# Patient Record
Sex: Male | Born: 1935 | Race: Black or African American | Hispanic: No | Marital: Married | Smoking: Former smoker
Health system: Southern US, Community
[De-identification: ages and names within clinical notes are randomized; demographics above are authoritative.]

## PROBLEM LIST (undated history)

## (undated) DIAGNOSIS — E538 Deficiency of other specified B group vitamins: Secondary | ICD-10-CM

## (undated) DIAGNOSIS — Z8673 Personal history of transient ischemic attack (TIA), and cerebral infarction without residual deficits: Secondary | ICD-10-CM

## (undated) DIAGNOSIS — H3530 Unspecified macular degeneration: Secondary | ICD-10-CM

## (undated) DIAGNOSIS — N183 Chronic kidney disease, stage 3 unspecified: Secondary | ICD-10-CM

## (undated) DIAGNOSIS — C61 Malignant neoplasm of prostate: Secondary | ICD-10-CM

## (undated) DIAGNOSIS — N200 Calculus of kidney: Secondary | ICD-10-CM

## (undated) DIAGNOSIS — G3184 Mild cognitive impairment, so stated: Secondary | ICD-10-CM

## (undated) DIAGNOSIS — H409 Unspecified glaucoma: Secondary | ICD-10-CM

## (undated) HISTORY — DX: Unspecified macular degeneration: H35.30

## (undated) HISTORY — DX: Deficiency of other specified B group vitamins: E53.8

## (undated) HISTORY — DX: Chronic kidney disease, stage 3 unspecified: N18.30

## (undated) HISTORY — DX: Malignant neoplasm of prostate: C61

## (undated) HISTORY — DX: Calculus of kidney: N20.0

## (undated) HISTORY — DX: Personal history of transient ischemic attack (TIA), and cerebral infarction without residual deficits: Z86.73

## (undated) HISTORY — DX: Mild cognitive impairment of uncertain or unknown etiology: G31.84

## (undated) HISTORY — DX: Chronic kidney disease, stage 3 (moderate): N18.3

## (undated) HISTORY — DX: Unspecified glaucoma: H40.9

---

## 1993-09-15 DIAGNOSIS — C61 Malignant neoplasm of prostate: Secondary | ICD-10-CM

## 1993-09-15 HISTORY — DX: Malignant neoplasm of prostate: C61

## 1993-09-15 HISTORY — PX: PROSTATE SURGERY: SHX751

## 2015-10-16 LAB — BASIC METABOLIC PANEL
BUN: 17 mg/dL (ref 4–21)
Creatinine: 1.6 mg/dL — AB (ref 0.6–1.3)
GLUCOSE: 78 mg/dL
Potassium: 4.3 mmol/L (ref 3.4–5.3)
SODIUM: 140 mmol/L (ref 137–147)

## 2015-10-16 LAB — HEPATIC FUNCTION PANEL
ALT: 6 U/L — AB (ref 10–40)
AST: 13 U/L — AB (ref 14–40)
Alkaline Phosphatase: 80 U/L (ref 25–125)
BILIRUBIN, TOTAL: 0.3 mg/dL

## 2015-10-16 LAB — CBC AND DIFFERENTIAL
HEMATOCRIT: 36 % — AB (ref 41–53)
HEMOGLOBIN: 11.2 g/dL — AB (ref 13.5–17.5)
PLATELETS: 187 10*3/uL (ref 150–399)
WBC: 4.5 10*3/mL

## 2016-02-22 ENCOUNTER — Telehealth: Payer: Self-pay | Admitting: *Deleted

## 2016-02-22 NOTE — Telephone Encounter (Signed)
Unable to reach patient at time of pre-visit call. Appt confirmed w/ pt's son who stated that pt "doesn't speak." Unable to complete pre-visit info w/ pt's son as there is no DPR on file.

## 2016-02-25 ENCOUNTER — Encounter: Payer: Self-pay | Admitting: Family Medicine

## 2016-02-25 ENCOUNTER — Ambulatory Visit (INDEPENDENT_AMBULATORY_CARE_PROVIDER_SITE_OTHER): Payer: Medicare Other | Admitting: Family Medicine

## 2016-02-25 VITALS — BP 109/70 | HR 64 | Temp 98.3°F | Ht 66.0 in | Wt 108.0 lb

## 2016-02-25 DIAGNOSIS — E538 Deficiency of other specified B group vitamins: Secondary | ICD-10-CM | POA: Diagnosis not present

## 2016-02-25 DIAGNOSIS — D508 Other iron deficiency anemias: Secondary | ICD-10-CM | POA: Diagnosis not present

## 2016-02-25 DIAGNOSIS — L309 Dermatitis, unspecified: Secondary | ICD-10-CM | POA: Diagnosis not present

## 2016-02-25 LAB — CBC WITH DIFFERENTIAL/PLATELET
BASOS PCT: 0.6 % (ref 0.0–3.0)
Basophils Absolute: 0 10*3/uL (ref 0.0–0.1)
EOS PCT: 3.7 % (ref 0.0–5.0)
Eosinophils Absolute: 0.2 10*3/uL (ref 0.0–0.7)
HCT: 33.9 % — ABNORMAL LOW (ref 39.0–52.0)
Hemoglobin: 10.8 g/dL — ABNORMAL LOW (ref 13.0–17.0)
LYMPHS ABS: 1.4 10*3/uL (ref 0.7–4.0)
Lymphocytes Relative: 32.1 % (ref 12.0–46.0)
MCHC: 31.9 g/dL (ref 30.0–36.0)
MCV: 87.6 fl (ref 78.0–100.0)
MONOS PCT: 8.1 % (ref 3.0–12.0)
Monocytes Absolute: 0.3 10*3/uL (ref 0.1–1.0)
NEUTROS PCT: 55.5 % (ref 43.0–77.0)
Neutro Abs: 2.3 10*3/uL (ref 1.4–7.7)
Platelets: 202 10*3/uL (ref 150.0–400.0)
RBC: 3.87 Mil/uL — AB (ref 4.22–5.81)
RDW: 14.9 % (ref 11.5–15.5)
WBC: 4.2 10*3/uL (ref 4.0–10.5)

## 2016-02-25 LAB — FERRITIN: FERRITIN: 311.6 ng/mL (ref 22.0–322.0)

## 2016-02-25 LAB — VITAMIN B12: Vitamin B-12: 463 pg/mL (ref 211–911)

## 2016-02-25 MED ORDER — TRIAMCINOLONE ACETONIDE 0.1 % EX CREA: 1.0000 "application " | TOPICAL_CREAM | Freq: Two times a day (BID) | CUTANEOUS | Status: DC

## 2016-02-25 NOTE — Progress Notes (Signed)
[place] at [facility] [address], [place], [place] [address] [phone]  Date:  02/25/2016   Name:  Jonathan Lawson   DOB:  [birth year]   MRN:  [id number]  PCP:  Lamar Blinks, MD    Chief Complaint: Establish Care   History of Present Illness:  Jonathan Lawson is a 82 y.o. very pleasant male patient who presents with the following:  Here today to establish care- he has moved to this area to be closer to his son.    He has generally been in good health over the years, and just used B12 shots and po iron He has been getting B12 shots for a couple of years.   He last had a b12shot last month.  They wonder if they might be able to use an oral supplement for convenience   He has had surgery for prostate cancer He and his wife of 64 years live with their son.  He worked in Engineer, maintenance, and also worked as a Theme park manager. They had 9 children, 1 passed away about 8 years ago.   They have noticed a hyperpigmntation in the flexor surfaces of both ankles for a couple of weeks.  It is somewhat itchy There are no active problems to display for this patient.   No past medical history on file.  No past surgical history on file.  Social History  Substance Use Topics  . Smoking status: Not on file  . Smokeless tobacco: Not on file  . Alcohol Use: Not on file    No family history on file.  No Known Allergies  Medication list has been reviewed and updated.  No current outpatient prescriptions on file prior to visit.   No current facility-administered medications on file prior to visit.    Review of Systems:  As per HPI- otherwise negative.   Physical Examination: Filed Vitals:   02/25/16 1341  BP: 109/70  Pulse: 64  Temp: 98.3 F (36.8 C)    Ideal Body Weight:    GEN: WDWN, NAD, Non-toxic, A & O x 3, slim build, does not say much but defers to his family. Looks well HEENT: Atraumatic, Normocephalic. Neck supple.  No masses, No LAD. Ears and Nose: No external deformity. CV: RRR, No M/G/R. No JVD. No thrill. No extra heart sounds. PULM: CTA B, no wheezes, crackles, rhonchi. No retractions. No resp. distress. No accessory muscle use. ABD: S, NT, ND EXTR: No c/c/e NEURO Normal gait for age PSYCH: Normally interactive. Conversant. Not depressed or anxious appearing.  Calm demeanor.  Mild eczema appearance of the anterior ankles    Assessment and Plan: Vitamin B12 deficiency - Plan: B12, CBC with Differential/Platelet  Other iron deficiency anemias - Plan: Ferritin, CBC with Differential/Platelet  Eczema - Plan: triamcinolone cream (KENALOG) 0.1 %  Use the cream on your ankles as needed I will be in touch with your labs asap; we will see if you need to continue B12 shots.  Hopefully we will be able to change to an oral medication for convenience.    Great to see you today!    Signed Lamar Blinks, MD

## 2016-02-25 NOTE — Patient Instructions (Signed)
Use the cream on your ankles as needed I will be in touch with your labs asap; we will see if you need to continue B12 shots.  Hopefully we will be able to change to an oral medication for convenience.    Great to see you today!

## 2016-03-16 ENCOUNTER — Encounter: Payer: Self-pay | Admitting: Family Medicine

## 2016-03-16 DIAGNOSIS — E538 Deficiency of other specified B group vitamins: Secondary | ICD-10-CM | POA: Insufficient documentation

## 2016-03-16 DIAGNOSIS — N189 Chronic kidney disease, unspecified: Secondary | ICD-10-CM

## 2016-03-16 DIAGNOSIS — N183 Chronic kidney disease, stage 3 unspecified: Secondary | ICD-10-CM | POA: Insufficient documentation

## 2016-03-16 DIAGNOSIS — Z8679 Personal history of other diseases of the circulatory system: Secondary | ICD-10-CM | POA: Insufficient documentation

## 2016-03-16 DIAGNOSIS — F03918 Unspecified dementia, unspecified severity, with other behavioral disturbance: Secondary | ICD-10-CM | POA: Insufficient documentation

## 2016-03-16 DIAGNOSIS — D631 Anemia in chronic kidney disease: Secondary | ICD-10-CM | POA: Insufficient documentation

## 2016-03-16 DIAGNOSIS — F0391 Unspecified dementia with behavioral disturbance: Secondary | ICD-10-CM | POA: Insufficient documentation

## 2016-03-16 DIAGNOSIS — H3530 Unspecified macular degeneration: Secondary | ICD-10-CM | POA: Insufficient documentation

## 2016-03-16 DIAGNOSIS — Z8546 Personal history of malignant neoplasm of prostate: Secondary | ICD-10-CM | POA: Insufficient documentation

## 2016-03-16 DIAGNOSIS — H409 Unspecified glaucoma: Secondary | ICD-10-CM | POA: Insufficient documentation

## 2016-03-21 ENCOUNTER — Other Ambulatory Visit: Payer: Self-pay | Admitting: Family Medicine

## 2016-03-27 ENCOUNTER — Other Ambulatory Visit: Payer: Self-pay | Admitting: Emergency Medicine

## 2016-03-27 ENCOUNTER — Telehealth: Payer: Self-pay | Admitting: Family Medicine

## 2016-03-27 DIAGNOSIS — G3183 Dementia with Lewy bodies: Principal | ICD-10-CM

## 2016-03-27 DIAGNOSIS — F0280 Dementia in other diseases classified elsewhere without behavioral disturbance: Secondary | ICD-10-CM

## 2016-03-27 MED ORDER — MEMANTINE HCL 10 MG PO TABS: 10.0000 mg | ORAL_TABLET | Freq: Two times a day (BID) | ORAL | Status: AC

## 2016-03-27 NOTE — Telephone Encounter (Signed)
Caller name: Gwyndolyn Saxon Relation to pt:son Call back number:[phone] Pharmacy: [facility]  Reason for call: pt is needing rx, pt is completely out of the meds   memantine (NAMENDA) 10 MG tablet      Please call to confirm rx has been sent.

## 2016-03-27 NOTE — Telephone Encounter (Signed)
Relation to [id number] Call back number:[phone] Pharmacy:  [facility] [place], [place] [phone] (Phone) [phone] (Fax)       son for call:  Patient requesting a refill memantine (NAMENDA) 10 MG tablet

## 2016-09-25 ENCOUNTER — Encounter: Payer: Self-pay | Admitting: Family Medicine

## 2016-09-25 ENCOUNTER — Ambulatory Visit (INDEPENDENT_AMBULATORY_CARE_PROVIDER_SITE_OTHER): Payer: Medicare Other | Admitting: Family Medicine

## 2016-09-25 VITALS — BP 106/53 | HR 65 | Temp 97.8°F | Ht 66.0 in | Wt 105.0 lb

## 2016-09-25 DIAGNOSIS — R54 Age-related physical debility: Secondary | ICD-10-CM

## 2016-09-25 DIAGNOSIS — F0390 Unspecified dementia without behavioral disturbance: Secondary | ICD-10-CM | POA: Diagnosis not present

## 2016-09-25 NOTE — Patient Instructions (Signed)
It was wonderful to see you today If there is anything I can do let me know. Otherwise continue to eat and live well!

## 2016-09-25 NOTE — Progress Notes (Signed)
[place] at [facility] [address], [place], [place] [address] [phone] [phone]  Date:  09/25/2016   Name:  Jonathan Lawson   DOB:  August 21, 1922   MRN:  [id number]  PCP:  Lamar Blinks, MD    Chief Complaint: No chief complaint on file.   History of Present Illness:  Jonathan Lawson is a 83 y.o. very pleasant male patient who presents with the following:  Seen by myself once in June of 2017 when we discussed some skin rash He is living with his son Gwyndolyn Saxon and his wife of over 44 years Idella. They have 8 living children who work together in caring for them Brayten has significant dementia but thankfully he is managing very well at home His mood is generally good, he is calm and eats well.    No behavorial concerns He loves to be around his daughters - [name] is with them today.   They are rarely home alone- his daughter notes that he does not wander at all.  This is a concern for her but for the time being he is doing well in a home setting He is continent still which is great He is able to dress himself, bathe, but does not fix meals, etc.  He does not drive They have not noted any evidence of pain or any other concerns about his well being  He does have CRI and anemia per most recent labs  Wt Readings from Last 3 Encounters:  09/25/16 105 lb (47.6 kg)  02/25/16 108 lb (49 kg)   BP Readings from Last 3 Encounters:  09/25/16 (!) 106/53  02/25/16 109/70   Lab on 03/21/2016  Component Date Value Ref Range Status  . Hemoglobin 10/16/2015 11.2* 13.5 - 17.5 g/dL Final  . HCT 10/16/2015 36* 41 - 53 % Final  . Platelets 10/16/2015 187  150 - 399 K/L Final  . WBC 10/16/2015 4.5  10^3/mL Final  . Glucose 10/16/2015 78  mg/dL Final  . BUN 10/16/2015 17  4 - 21 mg/dL Final  . Creatinine 10/16/2015 1.6* 0.6 - 1.3 mg/dL Final  . Potassium 10/16/2015 4.3  3.4 - 5.3 mmol/L Final  . Sodium 10/16/2015 140  137 - 147 mmol/L Final  . Alkaline  Phosphatase 10/16/2015 80  25 - 125 U/L Final  . ALT 10/16/2015 6* 10 - 40 U/L Final  . AST 10/16/2015 13* 14 - 40 U/L Final  . Bilirubin, Total 10/16/2015 0.3  mg/dL Final  . Hemoglobin A1C 10/16/2015 11.2   Final     Patient Active Problem List   Diagnosis Date Noted  . Glaucoma 03/16/2016  . Macular degeneration 03/16/2016  . Anemia in chronic kidney disease 03/16/2016  . CKD (chronic kidney disease) stage 3, GFR 30-59 ml/min 03/16/2016  . B12 deficiency 03/16/2016  . Dementia 03/16/2016  . History of prostate cancer 03/16/2016  . History of subdural hematoma 03/16/2016    Past Medical History:  Diagnosis Date  . B12 deficiency   . CKD (chronic kidney disease) stage 3, GFR 30-59 ml/min   . Glaucoma   . History of CVA (cerebrovascular accident)   . Macular degeneration   . Mild cognitive impairment   . Nephrolithiasis   . Prostate cancer ([place]) 1995    Past Surgical History:  Procedure Laterality Date  . [place]    Social History  Substance Use Topics  . Smoking status: Former Research scientist (life sciences)  . Smokeless  tobacco: Never Used  . Alcohol use No    Family History  Problem Relation Age of Onset  . Breast cancer Mother   . Colon cancer Maternal Grandmother   . Alcoholism Brother   . Colon cancer Son   . Prostate cancer Brother   . Prostate cancer Son   . Prostate cancer Son   . Prostate cancer Son     No Known Allergies  Medication list has been reviewed and updated.  Current Outpatient Prescriptions on File Prior to Visit  Medication Sig Dispense Refill  . IRON PO Take 1 tablet by mouth daily.    . memantine (NAMENDA) 10 MG tablet Take 1 tablet (10 mg total) by mouth 2 (two) times daily. 60 tablet 9  . naproxen sodium (ANAPROX) 220 MG tablet Take 220 mg by mouth daily.    Marland Kitchen triamcinolone cream (KENALOG) 0.1 % Apply 1 application topically 2 (two) times daily. Use as needed for rash on ankles 45 g 1   No current facility-administered medications on  file prior to visit.     Review of Systems:  As per HPI- otherwise negative.   Physical Examination: Blood pressure (!) 106/53, pulse 65, temperature 97.8 F (36.6 C), temperature source Oral, height 5\' 6"  (1.676 m), weight 105 lb (47.6 kg), SpO2 100 %.  GEN: WDWN, NAD, Non-toxic, A & O x 3, elderly gentleman who is well dressed, groomed, and clean. He looks well.  Thin build  HEENT: Atraumatic, Normocephalic. Neck supple. No masses, No LAD. Ears and Nose: No external deformity. CV: RRR, No M/G/R. No JVD. No thrill. No extra heart sounds. PULM: CTA B, no wheezes, crackles, rhonchi. No retractions. No resp. distress. No accessory muscle use. EXTR: No c/c/e NEURO Normal gait for very elderly person PSYCH: Normally interactive. Conversant. Not depressed or anxious appearing.  Calm demeanor.    Assessment and Plan: Frail elderly  Dementia without behavioral disturbance, unspecified dementia type  Here today to check in Offered labs and a flu shot.  At this time his wife and daughter prefer to avoid these services as needles upset him.  They are aware that his kidneys are not normal but at most recent labs a year ago he was not at the point of needing any other intervention. Given his age and dementia they prefer to observe with minimal intervention which I think is quite reasonable   Noted abstracted hemoglobin A1c of 11.2 in chart- however looking through old records this is actually just his hemoglobin. Will have this corrected   Signed Lamar Blinks, MD

## 2016-09-26 NOTE — Progress Notes (Signed)
Order(s) created erroneously. Erroneous order ID: [id number]  Order moved by: Willy Eddy  Order move date/time: 09/26/2016 3:56 PM  Source Patient: [id number]  Source Contact: 03/21/2016  Destination Patient: [id number]  Destination Contact: 11/30/2012

## 2016-09-26 NOTE — Progress Notes (Signed)
Order(s) created erroneously. Erroneous order ID: [id number]  Order moved by: Willy Eddy  Order move date/time: 09/26/2016 3:57 PM  Source Patient: [id number]  Source Contact: 03/21/2016  Destination Patient: [id number]  Destination Contact: 11/30/2012

## 2016-10-28 ENCOUNTER — Emergency Department (HOSPITAL_COMMUNITY)
Admission: EM | Admit: 2016-10-28 | Discharge: 2016-10-28 | Disposition: A | Discharge status: Home / Self Care | Payer: Medicare Other | Attending: Emergency Medicine | Admitting: Emergency Medicine

## 2016-10-28 ENCOUNTER — Telehealth: Payer: Self-pay | Admitting: Internal Medicine

## 2016-10-28 ENCOUNTER — Encounter (HOSPITAL_COMMUNITY): Payer: Self-pay

## 2016-10-28 DIAGNOSIS — N183 Chronic kidney disease, stage 3 (moderate): Secondary | ICD-10-CM | POA: Insufficient documentation

## 2016-10-28 DIAGNOSIS — Z8673 Personal history of transient ischemic attack (TIA), and cerebral infarction without residual deficits: Secondary | ICD-10-CM | POA: Insufficient documentation

## 2016-10-28 DIAGNOSIS — F0391 Unspecified dementia with behavioral disturbance: Secondary | ICD-10-CM | POA: Diagnosis not present

## 2016-10-28 DIAGNOSIS — R7989 Other specified abnormal findings of blood chemistry: Secondary | ICD-10-CM | POA: Diagnosis not present

## 2016-10-28 DIAGNOSIS — Z87891 Personal history of nicotine dependence: Secondary | ICD-10-CM | POA: Insufficient documentation

## 2016-10-28 DIAGNOSIS — F918 Other conduct disorders: Secondary | ICD-10-CM | POA: Diagnosis present

## 2016-10-28 LAB — URINALYSIS, ROUTINE W REFLEX MICROSCOPIC
BILIRUBIN URINE: NEGATIVE
GLUCOSE, UA: NEGATIVE mg/dL
Hgb urine dipstick: NEGATIVE
KETONES UR: NEGATIVE mg/dL
Leukocytes, UA: NEGATIVE
Nitrite: NEGATIVE
PH: 5 (ref 5.0–8.0)
Protein, ur: NEGATIVE mg/dL
Specific Gravity, Urine: 1.018 (ref 1.005–1.030)

## 2016-10-28 LAB — CBC WITH DIFFERENTIAL/PLATELET
Basophils Absolute: 0 10*3/uL (ref 0.0–0.1)
Basophils Relative: 0 %
Eosinophils Absolute: 0.1 10*3/uL (ref 0.0–0.7)
Eosinophils Relative: 1 %
HEMATOCRIT: 35 % — AB (ref 39.0–52.0)
HEMOGLOBIN: 10.9 g/dL — AB (ref 13.0–17.0)
LYMPHS ABS: 1.2 10*3/uL (ref 0.7–4.0)
LYMPHS PCT: 25 %
MCH: 26.3 pg (ref 26.0–34.0)
MCHC: 31.1 g/dL (ref 30.0–36.0)
MCV: 84.5 fL (ref 78.0–100.0)
MONO ABS: 0.3 10*3/uL (ref 0.1–1.0)
Monocytes Relative: 6 %
NEUTROS ABS: 3.2 10*3/uL (ref 1.7–7.7)
Neutrophils Relative %: 68 %
Platelets: 224 10*3/uL (ref 150–400)
RBC: 4.14 MIL/uL — ABNORMAL LOW (ref 4.22–5.81)
RDW: 14.8 % (ref 11.5–15.5)
WBC: 4.8 10*3/uL (ref 4.0–10.5)

## 2016-10-28 LAB — COMPREHENSIVE METABOLIC PANEL
ALBUMIN: 3.7 g/dL (ref 3.5–5.0)
ALK PHOS: 92 U/L (ref 38–126)
ALT: 9 U/L — ABNORMAL LOW (ref 17–63)
ANION GAP: 8 (ref 5–15)
AST: 18 U/L (ref 15–41)
BUN: 28 mg/dL — ABNORMAL HIGH (ref 6–20)
CALCIUM: 9.5 mg/dL (ref 8.9–10.3)
CHLORIDE: 111 mmol/L (ref 101–111)
CO2: 25 mmol/L (ref 22–32)
Creatinine, Ser: 1.96 mg/dL — ABNORMAL HIGH (ref 0.61–1.24)
GFR calc Af Amer: 32 mL/min — ABNORMAL LOW (ref >60)
GFR calc non Af Amer: 28 mL/min — ABNORMAL LOW (ref >60)
GLUCOSE: 127 mg/dL — AB (ref 65–99)
Potassium: 4.2 mmol/L (ref 3.5–5.1)
SODIUM: 144 mmol/L (ref 135–145)
Total Bilirubin: 1 mg/dL (ref 0.3–1.2)
Total Protein: 7.9 g/dL (ref 6.5–8.1)

## 2016-10-28 MED ORDER — LORAZEPAM 0.5 MG PO TABS: 0.5000 mg | ORAL_TABLET | Freq: Four times a day (QID) | ORAL | 0 refills | Status: AC | PRN

## 2016-10-28 NOTE — ED Notes (Signed)
[facility] at bedside

## 2016-10-28 NOTE — Telephone Encounter (Signed)
Patient currently at the ER, he has dementia with occasional outburst and behavioral issues. Case discussed with the ER attending,  willing to see him anytime as an outpatient, if they suspect he may need inpatient treatment for further care or medication adjustment may be appropriate be  send him straight from the ER to a facility (that will be difficult from the office). Again will see him when needed.

## 2016-10-28 NOTE — ED Notes (Signed)
Unable to obtain d/c VS pt would not sit still and would not sit in the stretcher.

## 2016-10-28 NOTE — ED Triage Notes (Signed)
Pt family stating patient is getting more agitated.  Trying to hit family members with objects.  Pt has dementia.  Pt pushing family member out of car and locking door.  Pt has eaten less than normal.  No fever.

## 2016-10-28 NOTE — ED Notes (Signed)
Bed: [facility] Expected date:  Expected time:  Means of arrival:  Comments: 

## 2016-10-28 NOTE — ED Notes (Signed)
Family at bedside. 

## 2016-10-28 NOTE — ED Provider Notes (Signed)
[facility] DEPT Provider Note   CSN: [id number] Arrival date & time: 10/28/16  1212     History   Chief Complaint Chief Complaint  Patient presents with  . Aggressive Behavior    HPI Jonathan Lawson is a 83 y.o. male.  Level V caveat: Dementia.   Jonathan Lawson is a 83 y.o. Male with a history of dementia who presents to the ED with his two sons who report the patient has been more aggressive intermittently at home over the past 5-6 months. He has been pushing family members and today picked up a trophy and acted like he was going to throw it at a family member. They reported the patient really has trouble with memory. He normally paces throughout the home during the day. He lives at home with his son and daughter-in-law who take care of him. He has been eating, but somewhat less than usual. He is currently on Namenda. No recent falls or recent illness. Patient denies any complaints to me. He whispers to me during the exam and interview. Family made an appointment with primary care for tomorrow morning. They deny any recent illness, coughing, changes to his urination, vomiting, diarrhea, falls or rashes.    The history is provided by the patient, a relative, a caregiver and medical records. No language interpreter was used.    Past Medical History:  Diagnosis Date  . B12 deficiency   . CKD (chronic kidney disease) stage 3, GFR 30-59 ml/min   . Glaucoma   . History of CVA (cerebrovascular accident)   . Macular degeneration   . Mild cognitive impairment   . Nephrolithiasis   . Prostate cancer [facility]) 1995    Patient Active Problem List   Diagnosis Date Noted  . Glaucoma 03/16/2016  . Macular degeneration 03/16/2016  . Anemia in chronic kidney disease 03/16/2016  . CKD (chronic kidney disease) stage 3, GFR 30-59 ml/min 03/16/2016  . B12 deficiency 03/16/2016  . Dementia 03/16/2016  . History of prostate cancer 03/16/2016  . History of subdural hematoma 03/16/2016    Past  Surgical History:  Procedure Laterality Date  . [place] Medications    Prior to Admission medications   Medication Sig Start Date End Date Taking? Authorizing Provider  cholecalciferol (VITAMIN D) 1000 units tablet Take 1,000 Units by mouth daily.   Yes Historical Provider, MD  IRON PO Take 1 tablet by mouth daily.   Yes Historical Provider, MD  memantine (NAMENDA) 10 MG tablet Take 1 tablet (10 mg total) by mouth 2 (two) times daily. 03/27/16  Yes Gay Filler Copland, MD  naproxen sodium (ANAPROX) 220 MG tablet Take 220 mg by mouth daily.   Yes Historical Provider, MD  triamcinolone cream (KENALOG) 0.1 % Apply 1 application topically 2 (two) times daily. Use as needed for rash on ankles 02/25/16  Yes Jessica C Copland, MD  LORazepam (ATIVAN) 0.5 MG tablet Take 1 tablet (0.5 mg total) by mouth every 6 (six) hours as needed for anxiety. 10/28/16   Waynetta Pean, PA-C    Family History Family History  Problem Relation Age of Onset  . Breast cancer Mother   . Colon cancer Maternal Grandmother   . Alcoholism Brother   . Colon cancer Son   . Prostate cancer Brother   . Prostate cancer Son   . Prostate cancer Son   . Prostate cancer Son     Social History Social History  Substance  Use Topics  . Smoking status: Former Research scientist (life sciences)  . Smokeless tobacco: Never Used  . Alcohol use No     Allergies   Patient has no known allergies.   Review of Systems Review of Systems  Unable to perform ROS: Dementia  Constitutional: Negative for fever.  Respiratory: Negative for cough and shortness of breath.   Gastrointestinal: Negative for abdominal pain, diarrhea and vomiting.  Skin: Negative for rash.  Psychiatric/Behavioral: Positive for behavioral problems.     Physical Exam Updated Vital Signs BP 112/59 (BP Location: Left Arm)   Pulse 72   Temp 98.1 F (36.7 C) (Oral)   SpO2 90%   Physical Exam  Constitutional: He appears well-developed and  well-nourished. No distress.  Nontoxic-appearing. Pleasant. Whispers answers to my questions.   HENT:  Head: Normocephalic and atraumatic.  Right Ear: External ear normal.  Left Ear: External ear normal.  Mouth/Throat: Oropharynx is clear and moist.  Eyes: Conjunctivae are normal. Pupils are equal, round, and reactive to light. Right eye exhibits no discharge. Left eye exhibits no discharge.  Neck: Neck supple.  Cardiovascular: Normal rate, regular rhythm, normal heart sounds and intact distal pulses.   Pulmonary/Chest: Effort normal and breath sounds normal. No respiratory distress. He has no wheezes. He has no rales.  Lungs clear to auscultation bilaterally.  Abdominal: Soft. There is no tenderness. There is no guarding.  Abdomen is soft nontender to palpation.  Musculoskeletal: Normal range of motion. He exhibits no tenderness.  Lymphadenopathy:    He has no cervical adenopathy.  Neurological: He is alert. Coordination normal.  Normal gait. Patient is alert to person only. He is alert and pleasant. He is cooperative with exam.   Skin: Skin is warm and dry. Capillary refill takes less than 2 seconds. No rash noted. He is not diaphoretic. No erythema. No pallor.  Psychiatric: He has a normal mood and affect. His behavior is normal. His mood appears not anxious. His affect is not angry. His speech is not rapid and/or pressured and not slurred. He is not agitated and not aggressive. Cognition and memory are impaired. He does not exhibit a depressed mood.  Nursing note and vitals reviewed.    ED Treatments / Results  Labs (all labs ordered are listed, but only abnormal results are displayed) Labs Reviewed  COMPREHENSIVE METABOLIC PANEL - Abnormal; Notable for the following:       Result Value   Glucose, Bld 127 (*)    BUN 28 (*)    Creatinine, Ser 1.96 (*)    ALT 9 (*)    GFR calc non Af Amer 28 (*)    GFR calc Af Amer 32 (*)    All other components within normal limits  CBC WITH  DIFFERENTIAL/PLATELET - Abnormal; Notable for the following:    RBC 4.14 (*)    Hemoglobin 10.9 (*)    HCT 35.0 (*)    All other components within normal limits  URINALYSIS, ROUTINE W REFLEX MICROSCOPIC    EKG  EKG Interpretation None       Radiology No results found.  Procedures Procedures (including critical care time)  Medications Ordered in ED Medications - No data to display   Initial Impression / Assessment and Plan / ED Course  I have reviewed the triage vital signs and the nursing notes.  Pertinent labs & imaging results that were available during my care of the patient were reviewed by me and considered in my medical decision making (see chart for details).  This is a 83 y.o. Male with a history of dementia who presents to the ED with his two sons who report the patient has been more aggressive intermittently at home over the past 5-6 months. He has been pushing family members and today picked up a trophy and acted like he was going to throw it at a family member. They reported the patient really has trouble with memory. He normally paces throughout the home during the day. He lives at home with his son and daughter-in-law who take care of him. He has been eating, but somewhat less than usual. He is currently on Namenda. No recent falls or recent illness. Patient denies any complaints to me.  On exam the patient is afebrile nontoxic appearing. He is calm and cooperative during interview and exam. He is pleasantly demented. CMP is remarkable for a creatinine of 1.96. Most recent blood work is from a year ago which is a creatinine of 1.7. CBC is around his baseline. No leukocytosis. Urinalysis is without sign of infection.  I called and spoke with the patient's PCP's office and spoke with Dr. Larose Kells. The patient has an appointment for follow up tomorrow. They feel comfortable seeing patient's with dementia. He reports they would have difficulty getting the patient inpatient  psychiatric care if he needs it. I do not feel the patient needs or would benefit from inpatient psychiatric care at this time. Will provide the family with a short course of 0.5 mg of Ativan to use as needed if the patient is agitated. I discussed dementia and its complications. I discussed precautions. I encouraged them to keep the appointment for follow-up tomorrow. I encouraged them to recheck his kidney function by primary care as it is mildly elevated today.  I discussed return precautions. I advised return to the emergency department with new or worsening symptoms or new concerns. The patient's sons verbalized understanding and agreement with plan.  This patient was discussed with and evaluated by Dr. Kathrynn Humble who agrees with assessment and plan.   Final Clinical Impressions(s) / ED Diagnoses   Final diagnoses:  Elevated serum creatinine  Dementia with behavioral disturbance, unspecified dementia type    New Prescriptions New Prescriptions   LORAZEPAM (ATIVAN) 0.5 MG TABLET    Take 1 tablet (0.5 mg total) by mouth every 6 (six) hours as needed for anxiety.     Waynetta Pean, PA-C 10/28/16 1619    Varney Biles, MD 10/29/16 [phone]

## 2016-10-28 NOTE — ED Notes (Signed)
Pt's son at bedside, pt lives with him and his wife, as with pt's wife.  He reports pt have been more aggressive towards him, picked up a trophy and acted like he was going to hit him with it.  He states that he has hx of dementia.  States pt's wife was brought to the ED last week, and pt became verbally aggressive with her and was asked to leave the room.  He reports pt's wife reported to them before that pt was aggressive towards her but did not believe her.  Pt is sitting on the stretcher calm at this time.

## 2016-10-29 ENCOUNTER — Encounter: Payer: Self-pay | Admitting: Family Medicine

## 2016-10-29 ENCOUNTER — Ambulatory Visit (INDEPENDENT_AMBULATORY_CARE_PROVIDER_SITE_OTHER): Payer: Medicare Other | Admitting: Family Medicine

## 2016-10-29 VITALS — BP 94/68 | Temp 97.5°F | Ht 66.0 in | Wt 99.4 lb

## 2016-10-29 DIAGNOSIS — F0391 Unspecified dementia with behavioral disturbance: Secondary | ICD-10-CM

## 2016-10-29 MED ORDER — DONEPEZIL HCL 5 MG PO TABS: 5.0000 mg | ORAL_TABLET | Freq: Every day | ORAL | 1 refills | Status: AC

## 2016-10-29 NOTE — Patient Instructions (Signed)
If you have any future issues, OK to use Ativan, but I would like him to be seen afterwards.

## 2016-10-29 NOTE — Progress Notes (Signed)
Pre visit review using our clinic review tool, if applicable. No additional management support is needed unless otherwise documented below in the visit note. 
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
## 2016-10-30 ENCOUNTER — Encounter (HOSPITAL_COMMUNITY): Payer: Self-pay | Admitting: *Deleted

## 2016-10-30 ENCOUNTER — Other Ambulatory Visit: Payer: Self-pay

## 2016-10-30 ENCOUNTER — Emergency Department (HOSPITAL_COMMUNITY): Payer: Medicare Other

## 2016-10-30 ENCOUNTER — Emergency Department (HOSPITAL_COMMUNITY)
Admission: EM | Admit: 2016-10-30 | Discharge: 2016-11-04 | Disposition: A | Discharge status: Home / Self Care | Payer: Medicare Other | Attending: Emergency Medicine | Admitting: Emergency Medicine

## 2016-10-30 DIAGNOSIS — N183 Chronic kidney disease, stage 3 (moderate): Secondary | ICD-10-CM | POA: Insufficient documentation

## 2016-10-30 DIAGNOSIS — F03918 Unspecified dementia, unspecified severity, with other behavioral disturbance: Secondary | ICD-10-CM

## 2016-10-30 DIAGNOSIS — F0391 Unspecified dementia with behavioral disturbance: Secondary | ICD-10-CM | POA: Diagnosis not present

## 2016-10-30 DIAGNOSIS — N189 Chronic kidney disease, unspecified: Secondary | ICD-10-CM

## 2016-10-30 DIAGNOSIS — Z5181 Encounter for therapeutic drug level monitoring: Secondary | ICD-10-CM | POA: Insufficient documentation

## 2016-10-30 DIAGNOSIS — F918 Other conduct disorders: Secondary | ICD-10-CM | POA: Insufficient documentation

## 2016-10-30 DIAGNOSIS — Z9889 Other specified postprocedural states: Secondary | ICD-10-CM | POA: Diagnosis not present

## 2016-10-30 DIAGNOSIS — Z8546 Personal history of malignant neoplasm of prostate: Secondary | ICD-10-CM | POA: Diagnosis not present

## 2016-10-30 DIAGNOSIS — N289 Disorder of kidney and ureter, unspecified: Secondary | ICD-10-CM | POA: Diagnosis not present

## 2016-10-30 DIAGNOSIS — Z811 Family history of alcohol abuse and dependence: Secondary | ICD-10-CM | POA: Diagnosis not present

## 2016-10-30 DIAGNOSIS — Z87891 Personal history of nicotine dependence: Secondary | ICD-10-CM | POA: Diagnosis not present

## 2016-10-30 DIAGNOSIS — E7870 Disorder of bile acid and cholesterol metabolism, unspecified: Secondary | ICD-10-CM | POA: Diagnosis not present

## 2016-10-30 DIAGNOSIS — Z79899 Other long term (current) drug therapy: Secondary | ICD-10-CM | POA: Diagnosis not present

## 2016-10-30 LAB — COMPREHENSIVE METABOLIC PANEL
ALT: 11 U/L — ABNORMAL LOW (ref 17–63)
ANION GAP: 10 (ref 5–15)
AST: 18 U/L (ref 15–41)
Albumin: 3.7 g/dL (ref 3.5–5.0)
Alkaline Phosphatase: 93 U/L (ref 38–126)
BILIRUBIN TOTAL: 1.1 mg/dL (ref 0.3–1.2)
BUN: 28 mg/dL — ABNORMAL HIGH (ref 6–20)
CO2: 24 mmol/L (ref 22–32)
Calcium: 9.6 mg/dL (ref 8.9–10.3)
Chloride: 112 mmol/L — ABNORMAL HIGH (ref 101–111)
Creatinine, Ser: 1.85 mg/dL — ABNORMAL HIGH (ref 0.61–1.24)
GFR, EST AFRICAN AMERICAN: 35 mL/min — AB (ref >60)
GFR, EST NON AFRICAN AMERICAN: 30 mL/min — AB (ref >60)
Glucose, Bld: 97 mg/dL (ref 65–99)
Potassium: 4.1 mmol/L (ref 3.5–5.1)
SODIUM: 146 mmol/L — AB (ref 135–145)
TOTAL PROTEIN: 7.9 g/dL (ref 6.5–8.1)

## 2016-10-30 LAB — CBC WITH DIFFERENTIAL/PLATELET
BASOS PCT: 0 %
Basophils Absolute: 0 10*3/uL (ref 0.0–0.1)
EOS PCT: 1 %
Eosinophils Absolute: 0 10*3/uL (ref 0.0–0.7)
HCT: 34.2 % — ABNORMAL LOW (ref 39.0–52.0)
HEMOGLOBIN: 10.6 g/dL — AB (ref 13.0–17.0)
Lymphocytes Relative: 24 %
Lymphs Abs: 1 10*3/uL (ref 0.7–4.0)
MCH: 26.4 pg (ref 26.0–34.0)
MCHC: 31 g/dL (ref 30.0–36.0)
MCV: 85.1 fL (ref 78.0–100.0)
Monocytes Absolute: 0.4 10*3/uL (ref 0.1–1.0)
Monocytes Relative: 9 %
NEUTROS PCT: 66 %
Neutro Abs: 2.9 10*3/uL (ref 1.7–7.7)
PLATELETS: 203 10*3/uL (ref 150–400)
RBC: 4.02 MIL/uL — AB (ref 4.22–5.81)
RDW: 14.9 % (ref 11.5–15.5)
WBC: 4.3 10*3/uL (ref 4.0–10.5)

## 2016-10-30 LAB — ETHANOL

## 2016-10-30 MED ORDER — MEMANTINE HCL 10 MG PO TABS
10.0000 mg | ORAL_TABLET | Freq: Two times a day (BID) | ORAL | Status: DC
  Administered 2016-10-30 – 2016-11-01 (×5): 10 mg via ORAL
  Filled 2016-10-30 (×6): qty 1

## 2016-10-30 MED ORDER — ACETAMINOPHEN 325 MG PO TABS: 650.0000 mg | ORAL_TABLET | ORAL | Status: DC | PRN

## 2016-10-30 MED ORDER — DONEPEZIL HCL 5 MG PO TABS
5.0000 mg | ORAL_TABLET | Freq: Every day | ORAL | Status: DC
  Administered 2016-10-31 – 2016-11-01 (×2): 5 mg via ORAL
  Filled 2016-10-30 (×2): qty 1

## 2016-10-30 MED ORDER — LORAZEPAM 1 MG PO TABS
1.0000 mg | ORAL_TABLET | Freq: Three times a day (TID) | ORAL | Status: DC | PRN
  Administered 2016-10-30 – 2016-11-01 (×4): 1 mg via ORAL
  Filled 2016-10-30 (×5): qty 1

## 2016-10-30 MED ORDER — ONDANSETRON HCL 4 MG PO TABS: 4.0000 mg | ORAL_TABLET | Freq: Three times a day (TID) | ORAL | Status: DC | PRN

## 2016-10-30 MED ORDER — ALUM & MAG HYDROXIDE-SIMETH 200-200-20 MG/5ML PO SUSP: 30.0000 mL | ORAL | Status: DC | PRN

## 2016-10-30 NOTE — ED Triage Notes (Signed)
Pt bib family.  Pt has not been sleeping and has show violent behavior towards family.  Family also states pt doesn't talk in any sense.  Pt talks on in a whisper. Pt needs lots of verbal cues in order to do anything.

## 2016-10-30 NOTE — BH Assessment (Signed)
[place] Assessment Progress Note  Per Waylan Boga, DNP, this pt requires psychiatric hospitalization at this time.   The following facilities have been contacted to seek placement for this pt, with results as noted:  Beds available, information sent, decision pending:  [place]:  [place], [place] Triage Specialist [phone]

## 2016-10-30 NOTE — ED Notes (Signed)
Pt lying in bed and continually fixing his blanket.  No aggressive behavior observed.

## 2016-10-30 NOTE — Progress Notes (Signed)
Please re-consult CSW once stable for discharge.  Kingsley Spittle, LCSWA Clinical Social Worker [phone]

## 2016-10-30 NOTE — ED Notes (Signed)
2 unsucessful attempts to draw blood by this RN.  Dept Phlebotomy to attempt.

## 2016-10-30 NOTE — ED Notes (Signed)
Pt is pacing in room, tampering with equipment in the room, ie, computer, scanner, etc.
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
## 2016-10-30 NOTE — ED Provider Notes (Signed)
[facility] DEPT Provider Note   CSN: [id number] Arrival date & time: 10/30/16  1037     History   Chief Complaint Chief Complaint  Patient presents with  . Aggressive Behavior    HPI Jonathan Lawson is a 83 y.o. male.  Pt presents to the ED today with aggressive and violent behavior.  The pt was seen here on the 13th for the same.  Pt was d/c home with ativan and f/u with pcp yesterday.  Sx have continued, so family brings him back.  Pt has no complaints.      Past Medical History:  Diagnosis Date  . B12 deficiency   . CKD (chronic kidney disease) stage 3, GFR 30-59 ml/min   . Glaucoma   . History of CVA (cerebrovascular accident)   . Macular degeneration   . Mild cognitive impairment   . Nephrolithiasis   . Prostate cancer [facility]) 1995    Patient Active Problem List   Diagnosis Date Noted  . Glaucoma 03/16/2016  . Macular degeneration 03/16/2016  . Anemia in chronic kidney disease 03/16/2016  . CKD (chronic kidney disease) stage 3, GFR 30-59 ml/min 03/16/2016  . B12 deficiency 03/16/2016  . Dementia 03/16/2016  . History of prostate cancer 03/16/2016  . History of subdural hematoma 03/16/2016    Past Surgical History:  Procedure Laterality Date  . [place] Medications    Prior to Admission medications   Medication Sig Start Date End Date Taking? Authorizing Provider  cholecalciferol (VITAMIN D) 1000 units tablet Take 1,000 Units by mouth daily.    Historical Provider, MD  donepezil (ARICEPT) 5 MG tablet Take 1 tablet (5 mg total) by mouth at bedtime. 10/29/16   Crosby Oyster Wendling, DO  IRON PO Take 1 tablet by mouth daily.    Historical Provider, MD  LORazepam (ATIVAN) 0.5 MG tablet Take 1 tablet (0.5 mg total) by mouth every 6 (six) hours as needed for anxiety. Patient not taking: Reported on 10/29/2016 10/28/16   Waynetta Pean, PA-C  memantine (NAMENDA) 10 MG tablet Take 1 tablet (10 mg total) by mouth 2 (two) times daily.  03/27/16   Gay Filler Copland, MD  naproxen sodium (ANAPROX) 220 MG tablet Take 220 mg by mouth daily.    Historical Provider, MD  triamcinolone cream (KENALOG) 0.1 % Apply 1 application topically 2 (two) times daily. Use as needed for rash on ankles 02/25/16   Darreld Mclean, MD    Family History Family History  Problem Relation Age of Onset  . Breast cancer Mother   . Colon cancer Maternal Grandmother   . Alcoholism Brother   . Colon cancer Son   . Prostate cancer Brother   . Prostate cancer Son   . Prostate cancer Son   . Prostate cancer Son     Social History Social History  Substance Use Topics  . Smoking status: Former Research scientist (life sciences)  . Smokeless tobacco: Never Used  . Alcohol use No     Allergies   Patient has no known allergies.   Review of Systems Review of Systems  Unable to perform ROS: Dementia     Physical Exam Updated Vital Signs BP 139/68   Pulse 66   Temp 98.3 F (36.8 C) (Oral)   Resp 18   SpO2 100%   Physical Exam  Constitutional: He appears well-developed and well-nourished.  HENT:  Head: Normocephalic and atraumatic.  Right Ear: External ear normal.  Left Ear:  External ear normal.  Nose: Nose normal.  Mouth/Throat: Oropharynx is clear and moist.  Eyes: Conjunctivae and EOM are normal. Pupils are equal, round, and reactive to light.  Neck: Normal range of motion. Neck supple.  Cardiovascular: Normal rate, regular rhythm, normal heart sounds and intact distal pulses.   Pulmonary/Chest: Effort normal and breath sounds normal.  Abdominal: Soft. Bowel sounds are normal.  Musculoskeletal: Normal range of motion.  Neurological: He is alert.  Pt moving all 4 extremities, but is not following commands.  He won't answer questions.  He is not currently agitated.  Skin: Skin is warm.  Nursing note and vitals reviewed.    ED Treatments / Results  Labs (all labs ordered are listed, but only abnormal results are displayed) Labs Reviewed    COMPREHENSIVE METABOLIC PANEL - Abnormal; Notable for the following:       Result Value   Sodium 146 (*)    Chloride 112 (*)    BUN 28 (*)    Creatinine, Ser 1.85 (*)    ALT 11 (*)    GFR calc non Af Amer 30 (*)    GFR calc Af Amer 35 (*)    All other components within normal limits  CBC WITH DIFFERENTIAL/PLATELET - Abnormal; Notable for the following:    RBC 4.02 (*)    Hemoglobin 10.6 (*)    HCT 34.2 (*)    All other components within normal limits  ETHANOL  RAPID URINE DRUG SCREEN, HOSP PERFORMED  URINALYSIS, ROUTINE W REFLEX MICROSCOPIC    EKG  EKG Interpretation  Date/Time:  Thursday October 30 2016 14:25:58 EST Ventricular Rate:  83 PR Interval:  126 QRS Duration: 92 QT Interval:  396 QTC Calculation: 465 R Axis:   -19 Text Interpretation:  Sinus rhythm with marked sinus arrhythmia with occasional Premature ventricular complexes Left ventricular hypertrophy with repolarization abnormality Abnormal ECG Confirmed by Gilford Raid MD, Doris Gruhn ([id number]) on 10/30/2016 3:50:49 PM       Radiology Dg Chest 2 View  Result Date: 10/30/2016 CLINICAL DATA:  Patient in the IVC and aggressive P a tear. Bile in the he week towards family. EXAM: CHEST  2 VIEW COMPARISON:  None. FINDINGS: The heart size and mediastinal contours are within normal limits. Both lungs are clear. The visualized skeletal structures are unremarkable. IMPRESSION: No active cardiopulmonary disease. Electronically Signed   By: Kathreen Devoid   On: 10/30/2016 15:37    Procedures Procedures (including critical care time)  Medications Ordered in ED Medications  LORazepam (ATIVAN) tablet 1 mg (not administered)  acetaminophen (TYLENOL) tablet 650 mg (not administered)  ondansetron (ZOFRAN) tablet 4 mg (not administered)  alum & mag hydroxide-simeth (MAALOX/MYLANTA) 200-200-20 MG/5ML suspension 30 mL (not administered)  donepezil (ARICEPT) tablet 5 mg (not administered)  memantine (NAMENDA) tablet 10 mg (not  administered)     Initial Impression / Assessment and Plan / ED Course  I have reviewed the triage vital signs and the nursing notes.  Pertinent labs & imaging results that were available during my care of the patient were reviewed by me and considered in my medical decision making (see chart for details).    TTS consult and SW consult placed.  Family no longer feels safe taking him home.  Facility placement will be sought.  Final Clinical Impressions(s) / ED Diagnoses   Final diagnoses:  Dementia with aggressive behavior  Chronic renal impairment, unspecified CKD stage    New Prescriptions New Prescriptions   No medications on file  Isla Pence, MD 10/30/16 [phone]

## 2016-10-31 DIAGNOSIS — Z9889 Other specified postprocedural states: Secondary | ICD-10-CM

## 2016-10-31 DIAGNOSIS — Z811 Family history of alcohol abuse and dependence: Secondary | ICD-10-CM | POA: Diagnosis not present

## 2016-10-31 DIAGNOSIS — Z803 Family history of malignant neoplasm of breast: Secondary | ICD-10-CM | POA: Diagnosis not present

## 2016-10-31 DIAGNOSIS — Z87891 Personal history of nicotine dependence: Secondary | ICD-10-CM

## 2016-10-31 DIAGNOSIS — Z79899 Other long term (current) drug therapy: Secondary | ICD-10-CM | POA: Diagnosis not present

## 2016-10-31 DIAGNOSIS — F0391 Unspecified dementia with behavioral disturbance: Secondary | ICD-10-CM

## 2016-10-31 DIAGNOSIS — Z800 Family history of malignant neoplasm of digestive organs: Secondary | ICD-10-CM | POA: Diagnosis not present

## 2016-10-31 DIAGNOSIS — Z8042 Family history of malignant neoplasm of prostate: Secondary | ICD-10-CM

## 2016-10-31 DIAGNOSIS — N189 Chronic kidney disease, unspecified: Secondary | ICD-10-CM

## 2016-10-31 MED ORDER — CITALOPRAM HYDROBROMIDE 10 MG PO TABS
10.0000 mg | ORAL_TABLET | Freq: Every day | ORAL | Status: DC
  Administered 2016-10-31 – 2016-11-04 (×4): 10 mg via ORAL
  Filled 2016-10-31 (×5): qty 1

## 2016-10-31 MED ORDER — QUETIAPINE FUMARATE 25 MG PO TABS
25.0000 mg | ORAL_TABLET | Freq: Every day | ORAL | Status: DC
  Administered 2016-10-31 – 2016-11-01 (×2): 25 mg via ORAL
  Filled 2016-10-31 (×2): qty 1

## 2016-10-31 NOTE — ED Notes (Signed)
Patient taking small sips of water out of cup but not able to drink through a straw.  Needs total assist.
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
## 2016-10-31 NOTE — ED Notes (Signed)
Patient's son assisted with his care and gave patient his shower.

## 2016-10-31 NOTE — ED Notes (Signed)
Patient resting in bed now.  Calm and cooperative.

## 2016-10-31 NOTE — ED Notes (Signed)
Patient not eating either.  Family at bedside and they report that he likes sweets and bananas.  Ordered patient a banana and cake and pudding.  Family encouraging patient to drink fluids by offering it frequently.

## 2016-11-01 DIAGNOSIS — F0391 Unspecified dementia with behavioral disturbance: Secondary | ICD-10-CM | POA: Diagnosis not present

## 2016-11-01 DIAGNOSIS — Z79899 Other long term (current) drug therapy: Secondary | ICD-10-CM | POA: Diagnosis not present

## 2016-11-01 DIAGNOSIS — Z811 Family history of alcohol abuse and dependence: Secondary | ICD-10-CM | POA: Diagnosis not present

## 2016-11-01 DIAGNOSIS — Z87891 Personal history of nicotine dependence: Secondary | ICD-10-CM | POA: Diagnosis not present

## 2016-11-01 NOTE — ED Notes (Signed)
Pt resting.  Attempted to give meds but tried to give water and pt didn't swallow as asked but swallowed it after pocketing it for a bit.  Did not give po meds as it didn't seem he would tolerate them.  Psych MD made aware.

## 2016-11-01 NOTE — ED Notes (Signed)
Pt refused to have his O2 taking at this time RN notity

## 2016-11-01 NOTE — ED Notes (Signed)
Dinner tray given. Pt refusing to eat and continuing to attempt to get out of bed. Attempted to walk w/ pt w/ 2 assist. Pt gait unsteady, placed back in bed. Pt increasingly agitated. Sitter at bedside.

## 2016-11-01 NOTE — ED Notes (Signed)
Pt is now awake and getting fidgety, trying to climb up and out of the bed.  He is now able to eat and drink unassisted so meds held earlier have now been given.

## 2016-11-01 NOTE — ED Notes (Signed)
Pt did not eat his meal at this time.

## 2016-11-01 NOTE — ED Notes (Signed)
Pt ate half of dinner tray. Pt has decreased in agitation. Cooperative at this time. Sitter at bedside.

## 2016-11-01 NOTE — ED Notes (Signed)
Pt is resting.  NAD noted.  Sitter is providing observation.

## 2016-11-01 NOTE — ED Notes (Signed)
Pt asleep.  NAD noted.  Sitter providing constant observation.
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
## 2016-11-02 ENCOUNTER — Emergency Department (HOSPITAL_COMMUNITY): Payer: Medicare Other

## 2016-11-02 LAB — CBC WITH DIFFERENTIAL/PLATELET
Basophils Absolute: 0 10*3/uL (ref 0.0–0.1)
Basophils Relative: 0 %
EOS ABS: 0.1 10*3/uL (ref 0.0–0.7)
EOS PCT: 2 %
HCT: 41 % (ref 39.0–52.0)
Hemoglobin: 12.9 g/dL — ABNORMAL LOW (ref 13.0–17.0)
LYMPHS ABS: 0.6 10*3/uL — AB (ref 0.7–4.0)
Lymphocytes Relative: 20 %
MCH: 26.9 pg (ref 26.0–34.0)
MCHC: 31.5 g/dL (ref 30.0–36.0)
MCV: 85.4 fL (ref 78.0–100.0)
MONO ABS: 0.2 10*3/uL (ref 0.1–1.0)
MONOS PCT: 7 %
Neutro Abs: 2.2 10*3/uL (ref 1.7–7.7)
Neutrophils Relative %: 71 %
PLATELETS: 157 10*3/uL (ref 150–400)
RBC: 4.8 MIL/uL (ref 4.22–5.81)
RDW: 15.2 % (ref 11.5–15.5)
WBC: 3.2 10*3/uL — AB (ref 4.0–10.5)

## 2016-11-02 LAB — URINALYSIS, ROUTINE W REFLEX MICROSCOPIC
BILIRUBIN URINE: NEGATIVE
GLUCOSE, UA: NEGATIVE mg/dL
HGB URINE DIPSTICK: NEGATIVE
Ketones, ur: NEGATIVE mg/dL
LEUKOCYTES UA: NEGATIVE
NITRITE: NEGATIVE
Protein, ur: NEGATIVE mg/dL
SPECIFIC GRAVITY, URINE: 1.019 (ref 1.005–1.030)
WBC, UA: NONE SEEN WBC/hpf (ref 0–5)
pH: 5 (ref 5.0–8.0)

## 2016-11-02 LAB — COMPREHENSIVE METABOLIC PANEL
ALT: 12 U/L — ABNORMAL LOW (ref 17–63)
ANION GAP: 3 — AB (ref 5–15)
AST: 29 U/L (ref 15–41)
Albumin: 2.8 g/dL — ABNORMAL LOW (ref 3.5–5.0)
Alkaline Phosphatase: 77 U/L (ref 38–126)
BUN: 19 mg/dL (ref 6–20)
CHLORIDE: 114 mmol/L — AB (ref 101–111)
CO2: 25 mmol/L (ref 22–32)
Calcium: 8.3 mg/dL — ABNORMAL LOW (ref 8.9–10.3)
Creatinine, Ser: 1.26 mg/dL — ABNORMAL HIGH (ref 0.61–1.24)
GFR, EST AFRICAN AMERICAN: 55 mL/min — AB (ref >60)
GFR, EST NON AFRICAN AMERICAN: 48 mL/min — AB (ref >60)
Glucose, Bld: 102 mg/dL — ABNORMAL HIGH (ref 65–99)
Potassium: 4.4 mmol/L (ref 3.5–5.1)
SODIUM: 142 mmol/L (ref 135–145)
Total Bilirubin: 1.1 mg/dL (ref 0.3–1.2)
Total Protein: 6.5 g/dL (ref 6.5–8.1)

## 2016-11-02 LAB — BLOOD GAS, VENOUS
ACID-BASE DEFICIT: 0.8 mmol/L (ref 0.0–2.0)
Bicarbonate: 25.1 mmol/L (ref 20.0–28.0)
O2 SAT: 22.8 %
PATIENT TEMPERATURE: 98.6
pCO2, Ven: 50.8 mmHg (ref 44.0–60.0)
pH, Ven: 7.315 (ref 7.250–7.430)

## 2016-11-02 LAB — RAPID URINE DRUG SCREEN, HOSP PERFORMED
Amphetamines: NOT DETECTED
BARBITURATES: NOT DETECTED
Benzodiazepines: POSITIVE — AB
Cocaine: NOT DETECTED
OPIATES: NOT DETECTED
TETRAHYDROCANNABINOL: NOT DETECTED

## 2016-11-02 LAB — AMMONIA: AMMONIA: 17 umol/L (ref 9–35)

## 2016-11-02 MED ORDER — DIPHENHYDRAMINE HCL 50 MG/ML IJ SOLN
25.0000 mg | Freq: Once | INTRAMUSCULAR | Status: AC
  Administered 2016-11-02: 25 mg via INTRAVENOUS
  Filled 2016-11-02: qty 1

## 2016-11-02 MED ORDER — SODIUM CHLORIDE 0.9 % IV BOLUS (SEPSIS)
1000.0000 mL | Freq: Once | INTRAVENOUS | Status: AC
  Administered 2016-11-02: 1000 mL via INTRAVENOUS

## 2016-11-02 MED ORDER — LORAZEPAM 0.5 MG PO TABS: 0.5000 mg | ORAL_TABLET | Freq: Three times a day (TID) | ORAL | Status: DC | PRN

## 2016-11-02 MED ORDER — DONEPEZIL HCL 5 MG PO TABS
10.0000 mg | ORAL_TABLET | Freq: Every day | ORAL | Status: DC
  Administered 2016-11-02 – 2016-11-03 (×2): 10 mg via ORAL
  Filled 2016-11-02 (×2): qty 2

## 2016-11-02 NOTE — ED Provider Notes (Signed)
83 yo M here for aggressive behavior at home and waiting placement for geriatric psych. I was called to the bedside because the nursing staff are concerned that the patient had not been eating or drinking for the past 3 or 4 days. He appears to be well sedated on my exam. He'll not cooperate with instructions. Clear lung sounds noted abdominal pain. With history of prostate cancer will obtain a CT of the head to evaluate for possible metastasis or bleeding. Will recheck labs for decreased oral intake and ensure he is not having renal failure.  Lab workup without significant finding.  Patient too agitated to CT.  I think most likely his symptoms are related to his sedation while in the ED, likely needs to be revisited.  If able will re attempt CT, though I would hesitate to add more sedating medications.    Deno Etienne, DO 11/02/16 1528

## 2016-11-02 NOTE — ED Notes (Signed)
Pt did not eat breakfast. Pt asleep

## 2016-11-02 NOTE — Progress Notes (Signed)
CSW faxed patient's referral to: [facility], [facility] Solian. [facility], [facility] and [facility].

## 2016-11-02 NOTE — ED Provider Notes (Signed)
Request to see patient from nurse due to increased agitation. Patient's medical records reviewed and there is some concern that patient may be overmedicated. On exam patient is alert and trying to get off the bed. Soft restraints placed for his safety.   Lacretia Leigh, MD 11/02/16 [phone]

## 2016-11-02 NOTE — ED Notes (Signed)
Patients iv came out RN Hui made aware.

## 2016-11-02 NOTE — ED Notes (Signed)
Radiology notified - patient is ready for imaging.

## 2016-11-02 NOTE — ED Notes (Signed)
Contacted MD, asked if he could see patient and family member.  Patient has not been eating as well as he was prior to coming to the hospital.  According to the family patient was ambulatory and was able to do some of his daily care activities for himself.  Patient is currently lethargic and has been being fed by the Tech.  Patient is eating small amounts of the tray mostly ice cream.  Family member is growing concerned for the patient.  MD advised he will come talk with patient and family member.

## 2016-11-02 NOTE — ED Notes (Signed)
Pt had 2 wet diapers that this NT changed.

## 2016-11-02 NOTE — ED Notes (Signed)
MD at bedside. 

## 2016-11-02 NOTE — BH Assessment (Signed)
Patient to be reassessed by TTS.   Attempted to assess patient and patient was asleep.  Will try again later.   Rosalin Hawking, LCSW Therapeutic Triage Specialist [place] 11/02/2016 10:41 AM

## 2016-11-02 NOTE — ED Notes (Signed)
CT notified patient is ready, second call

## 2016-11-02 NOTE — ED Notes (Signed)
Patient transported to CT, accompanied by safety sitter

## 2016-11-02 NOTE — ED Notes (Signed)
Patient constantly trying to get OOB and had pulled out his IV .  Limited sitter in house and family do not want any medication for sedation.  MD notified. Order received for restraints. Will continue to monitor.

## 2016-11-02 NOTE — ED Notes (Signed)
Patient given dinner tray, assisted with feeding by NT

## 2016-11-03 DIAGNOSIS — Z87891 Personal history of nicotine dependence: Secondary | ICD-10-CM | POA: Diagnosis not present

## 2016-11-03 DIAGNOSIS — F0391 Unspecified dementia with behavioral disturbance: Secondary | ICD-10-CM | POA: Diagnosis not present

## 2016-11-03 DIAGNOSIS — Z811 Family history of alcohol abuse and dependence: Secondary | ICD-10-CM | POA: Diagnosis not present

## 2016-11-03 DIAGNOSIS — Z79899 Other long term (current) drug therapy: Secondary | ICD-10-CM | POA: Diagnosis not present

## 2016-11-03 MED ORDER — LORAZEPAM 0.5 MG PO TABS: 0.5000 mg | ORAL_TABLET | Freq: Two times a day (BID) | ORAL | Status: DC | PRN

## 2016-11-03 MED ORDER — CARBAMAZEPINE ER 200 MG PO TB12
200.0000 mg | ORAL_TABLET | Freq: Two times a day (BID) | ORAL | Status: DC
  Administered 2016-11-03 – 2016-11-04 (×2): 200 mg via ORAL
  Filled 2016-11-03 (×3): qty 1

## 2016-11-03 NOTE — Progress Notes (Signed)
11/03/16  0957  Patient daughter-n-law requesting patient right eye be looked at.

## 2016-11-03 NOTE — Consult Note (Addendum)
[facility] Consult   Reason for Consult:  Agitation and aggression Referring Physician:  EDP Patient Identification: Jonathan Lawson MRN:  [id number] Principal Diagnosis: Dementia with behavioral disturbance Diagnosis:   Patient Active Problem List   Diagnosis Date Noted  . Dementia with behavioral disturbance [F03.91] 03/16/2016    Priority: High  . Glaucoma [H40.9] 03/16/2016  . Macular degeneration [H35.30] 03/16/2016  . Anemia in chronic kidney disease [N18.9, D63.1] 03/16/2016  . CKD (chronic kidney disease) stage 3, GFR 30-59 ml/min [N18.3] 03/16/2016  . B12 deficiency [E53.8] 03/16/2016  . History of prostate cancer [Z85.46] 03/16/2016  . History of subdural hematoma [Z86.79] 03/16/2016    Total Time spent with patient: 30 minutes  Subjective:   Jonathan Lawson is a 83 y.o. male patient admitted with aggression and agitation.  HPI:  83 yo male who presented to the ED with aggression.  Medications were started but he became to sleepy and they were discontinued yesterday.  Medications added today and will monitor for 24 hours as no geriatric facility was located for him.  No suicidal/homicidal ideations or hallucinations.  Past Psychiatric History: dementia  Risk to Self: Suicidal Ideation: No Suicidal Intent: No Is patient at risk for suicide?: No Suicidal Plan?: No Access to Means: No What has been your use of drugs/alcohol within the last 12 months?:  (n/a) How many times?:  (n/a) Other Self Harm Risks:  (none reported) Triggers for Past Attempts: Other (Comment) Intentional Self Injurious Behavior: None Risk to Others: Homicidal Ideation: No Thoughts of Harm to Others: No Current Homicidal Intent: No Current Homicidal Plan: No Access to Homicidal Means: No Identified Victim:  (n/a) History of harm to others?: No Assessment of Violence: None Noted Violent Behavior Description:  (patient is calm and cooperative ) Does patient have access to weapons?:  No Criminal Charges Pending?: No Does patient have a court date: No Prior Inpatient Therapy: Prior Inpatient Therapy: No Prior Therapy Dates:  (n/a) Prior Therapy Facilty/Provider(s):  (n/a) Reason for Treatment:  (n/a) Prior Outpatient Therapy: Prior Outpatient Therapy: No Prior Therapy Dates:  (n/a) Prior Therapy Facilty/Provider(s):  (n/a) Reason for Treatment:  (n/a) Does patient have an ACCT team?: No Does patient have Intensive In-House Services?  : No Does patient have [facility] services? : No Does patient have P4CC services?: No  Past Medical History:  Past Medical History:  Diagnosis Date  . B12 deficiency   . CKD (chronic kidney disease) stage 3, GFR 30-59 ml/min   . Glaucoma   . History of CVA (cerebrovascular accident)   . Macular degeneration   . Mild cognitive impairment   . Nephrolithiasis   . Prostate cancer ([place]) 1995    Past Surgical History:  Procedure Laterality Date  . PROSTATE SURGERY  1995   Family History:  Family History  Problem Relation Age of Onset  . Breast cancer Mother   . Colon cancer Maternal Grandmother   . Alcoholism Brother   . Colon cancer Son   . Prostate cancer Brother   . Prostate cancer Son   . Prostate cancer Son   . Prostate cancer Son    Family Psychiatric  History: none Social History:  History  Alcohol Use No     History  Drug Use No    Social History   Social History  . Marital status: Married    Spouse name: N/A  . Number of children: N/A  . Years of education: N/A   Social History Main Topics  .  Smoking status: Former Research scientist (life sciences)  . Smokeless tobacco: Never Used  . Alcohol use No  . Drug use: No  . Sexual activity: Not Asked   Other Topics Concern  . None   Social History Narrative  . None   Additional Social History:    Allergies:  No Known Allergies  Labs:  Results for orders placed or performed during the hospital encounter of 10/30/16 (from the past 48 hour(s))  Urinalysis, Routine w reflex  microscopic     Status: Abnormal   Collection Time: 11/02/16  6:00 AM  Result Value Ref Range   Color, Urine YELLOW YELLOW   APPearance CLEAR CLEAR   Specific Gravity, Urine 1.019 1.005 - 1.030   pH 5.0 5.0 - 8.0   Glucose, UA NEGATIVE NEGATIVE mg/dL   Hgb urine dipstick NEGATIVE NEGATIVE   Bilirubin Urine NEGATIVE NEGATIVE   Ketones, ur NEGATIVE NEGATIVE mg/dL   Protein, ur NEGATIVE NEGATIVE mg/dL   Nitrite NEGATIVE NEGATIVE   Leukocytes, UA NEGATIVE NEGATIVE   RBC / HPF 0-5 0 - 5 RBC/hpf   WBC, UA NONE SEEN 0 - 5 WBC/hpf   Bacteria, UA RARE (A) NONE SEEN   Squamous Epithelial / LPF 0-5 (A) NONE SEEN   Amorphous Crystal PRESENT   CBC with Differential     Status: Abnormal   Collection Time: 11/02/16  1:48 PM  Result Value Ref Range   WBC 3.2 (L) 4.0 - 10.5 K/uL   RBC 4.80 4.22 - 5.81 MIL/uL   Hemoglobin 12.9 (L) 13.0 - 17.0 g/dL   HCT 41.0 39.0 - 52.0 %   MCV 85.4 78.0 - 100.0 fL   MCH 26.9 26.0 - 34.0 pg   MCHC 31.5 30.0 - 36.0 g/dL   RDW 15.2 11.5 - 15.5 %   Platelets 157 150 - 400 K/uL   Neutrophils Relative % 71 %   Neutro Abs 2.2 1.7 - 7.7 K/uL   Lymphocytes Relative 20 %   Lymphs Abs 0.6 (L) 0.7 - 4.0 K/uL   Monocytes Relative 7 %   Monocytes Absolute 0.2 0.1 - 1.0 K/uL   Eosinophils Relative 2 %   Eosinophils Absolute 0.1 0.0 - 0.7 K/uL   Basophils Relative 0 %   Basophils Absolute 0.0 0.0 - 0.1 K/uL  Ammonia     Status: None   Collection Time: 11/02/16  1:48 PM  Result Value Ref Range   Ammonia 17 9 - 35 umol/L  Comprehensive metabolic panel     Status: Abnormal   Collection Time: 11/02/16  1:48 PM  Result Value Ref Range   Sodium 142 135 - 145 mmol/L   Potassium 4.4 3.5 - 5.1 mmol/L   Chloride 114 (H) 101 - 111 mmol/L   CO2 25 22 - 32 mmol/L   Glucose, Bld 102 (H) 65 - 99 mg/dL   BUN 19 6 - 20 mg/dL   Creatinine, Ser 1.26 (H) 0.61 - 1.24 mg/dL   Calcium 8.3 (L) 8.9 - 10.3 mg/dL   Total Protein 6.5 6.5 - 8.1 g/dL   Albumin 2.8 (L) 3.5 - 5.0 g/dL    AST 29 15 - 41 U/L   ALT 12 (L) 17 - 63 U/L   Alkaline Phosphatase 77 38 - 126 U/L   Total Bilirubin 1.1 0.3 - 1.2 mg/dL   GFR calc non Af Amer 48 (L) >60 mL/min   GFR calc Af Amer 55 (L) >60 mL/min    Comment: (NOTE) The eGFR has been calculated using the  CKD EPI equation. This calculation has not been validated in all clinical situations. eGFR's persistently <60 mL/min signify possible Chronic Kidney Disease.    Anion gap 3 (L) 5 - 15  Blood gas, venous     Status: None   Collection Time: 11/02/16  2:17 PM  Result Value Ref Range   pH, Ven 7.315 7.250 - 7.430   pCO2, Ven 50.8 44.0 - 60.0 mmHg   pO2, Ven BELOW REPORTABLE RANGE 32.0 - 45.0 mmHg    Comment: RBV DAN FLOYD MD BY ANGIE DUNLAP ON 11/02/16 AT 1419   Bicarbonate 25.1 20.0 - 28.0 mmol/L   Acid-base deficit 0.8 0.0 - 2.0 mmol/L   O2 Saturation 22.8 %   Patient temperature 98.6    Collection site VEIN    Drawn by COLLECTED BY LABORATORY    Sample type VENOUS     Current Facility-Administered Medications  Medication Dose Route Frequency Provider Last Rate Last Dose  . acetaminophen (TYLENOL) tablet 650 mg  650 mg Oral Q4H PRN Isla Pence, MD      . alum & mag hydroxide-simeth (MAALOX/MYLANTA) 200-200-20 MG/5ML suspension 30 mL  30 mL Oral PRN Isla Pence, MD      . carbamazepine (TEGRETOL XR) 12 hr tablet 200 mg  200 mg Oral BID Patrecia Pour, NP   200 mg at 11/03/16 0944  . citalopram (CELEXA) tablet 10 mg  10 mg Oral Daily Corena Pilgrim, MD   10 mg at 11/03/16 0944  . donepezil (ARICEPT) tablet 10 mg  10 mg Oral QHS Lurena Nida, NP   10 mg at 11/02/16 2051  . LORazepam (ATIVAN) tablet 0.5 mg  0.5 mg Oral BID PRN Patrecia Pour, NP      . ondansetron [facility]) tablet 4 mg  4 mg Oral Q8H PRN Isla Pence, MD       Current Outpatient Prescriptions  Medication Sig Dispense Refill  . cholecalciferol (VITAMIN D) 1000 units tablet Take 1,000 Units by mouth daily.    Marland Kitchen donepezil (ARICEPT) 5 MG tablet Take 1 tablet  (5 mg total) by mouth at bedtime. 30 tablet 1  . ferrous sulfate 325 (65 FE) MG tablet Take 325 mg by mouth daily with breakfast.    . LORazepam (ATIVAN) 0.5 MG tablet Take 1 tablet (0.5 mg total) by mouth every 6 (six) hours as needed for anxiety. 10 tablet 0  . memantine (NAMENDA) 10 MG tablet Take 1 tablet (10 mg total) by mouth 2 (two) times daily. 60 tablet 9  . naproxen sodium (ANAPROX) 220 MG tablet Take 220 mg by mouth daily.    Marland Kitchen triamcinolone cream (KENALOG) 0.1 % Apply 1 application topically 2 (two) times daily as needed (for rash on ankles).      Musculoskeletal: Strength & Muscle Tone: decreased Gait & Station: unsteady Patient leans: N/A  Psychiatric Specialty Exam: Physical Exam  Constitutional: He appears well-developed.  HENT:  Head: Normocephalic.  Respiratory: Effort normal.  Musculoskeletal: Normal range of motion.  Neurological: He is alert.  Psychiatric: His speech is normal and behavior is normal. Thought content normal. His affect is labile. Cognition and memory are impaired. He expresses impulsivity.    Review of Systems  Psychiatric/Behavioral: Positive for memory loss.  All other systems reviewed and are negative.   Blood pressure 129/57, pulse 84, temperature 99 F (37.2 C), temperature source Oral, resp. rate 18, height 5' 6"  (1.676 m), weight 44.9 kg (99 lb), SpO2 100 %.Body mass index is 15.98 kg/m.  General Appearance: Casual  Eye Contact:  Fair  Speech:  Normal Rate  Volume:  Decreased  Mood:  Anxious  Affect:  Blunt  Thought Process:  Irrelevant  Orientation:  Other:  alert  Thought Content:  difficult to identify, confused patietn  Suicidal Thoughts:  No  Homicidal Thoughts:  No  Memory:  Immediate;   Poor Recent;   Poor Remote;   Poor  Judgement:  Impaired  Insight:  Lacking  Psychomotor Activity:  Normal  Concentration:  Concentration: Poor and Attention Span: Poor  Recall:  Poor  Fund of Knowledge:  Fair  Language:  Fair   Akathisia:  No  Handed:  Right  AIMS (if indicated):     Assets:  Leisure Time Resilience Social Support  ADL's:  Impaired  Cognition:  Impaired,  Severe  Sleep:        Treatment Plan Summary: Daily contact with patient to assess and evaluate symptoms and progress in treatment, Medication management and Plan dementia with behavioral disturbances:  -Crisis stabilization -Medication management:  Continue medical medications along with Aricept 10 mg daily for dementia.  Start Tegretol 200 mg BID for mood stabilization and Ativan 0.5 mg BID PRN anxiety -Individual counseling  Disposition: Recommend psychiatric Inpatient admission when medically cleared.  Waylan Boga, NP 11/03/2016 5:44 PM  Patient seen face-to-face for psychiatric evaluation, chart reviewed and case discussed with the physician extender and developed treatment plan. Reviewed the information documented and agree with the treatment plan. Corena Pilgrim, MD

## 2016-11-03 NOTE — Progress Notes (Addendum)
CSW received a call from [facility] at ph: [phone] in admitting at [facility] who asked about pt's ability to perform ADL's.  CSW informed [facility] pt was 83 years old with HX of Dementia and that ADL's were limited, per pt's RN.   [facility] also stated they would not be able to accommodate a pt that might require a Foley catheter, per our EDP's inquiry.  [facility] stated they would not be able to take the pt at this time due to limited ability to perform ADL's.  CSW will inform disposition.  CSW spoke with pt's son Naziah Urda who was bedside and informed him pt's potential placement at [facility] was not appropriate, per [facility], at this time.  CSW will continue to follow.    Alphonse Guild. Marcoantonio Legault, Latanya Presser, LCAS Clinical Social Worker Ph: [phone]

## 2016-11-03 NOTE — BH Assessment (Signed)
Per Dr. Darleene Cleaver and Waylan Boga, DNP, patient continues to meet criteria for [facility] placement. TTS to seek placement. Patient referred to [facility], [facility], [facility], [facility], [facility], [facility], [facility], and [facility] on 10/30/2016. Declined at [facility], [facility], [facility], and [facility]. Writer followed up with the following facilities:    *Per Candy/Cindy @ [place] [facility] referral was thrown away last week. Writer re faxed today. Candy sts that they have two beds available.   *Per Cedrick @ [facility] no referral for this patient. Writer re faxed referral. [facility]rick sts that they have plenty of beds.   *Per [facility] no bed availability.  * Per Amanada @ [facility] no bed availability.  * Per Clarene Critchley @ [address] [facility] no beds available today, however; #3 discharges are expected tomorrow. Writer re faxed referral for consideration of a [facility] bed.     *Per Cathy @ [facility] referrals was received but n obeds will be available today or tomorrow.    *Per Clarene Critchley @ [facility] beds available. Faxed referral to facility for consideration.   * Per Roderic Palau @ [place] [facility] patient declined due Dementia diagnosis.

## 2016-11-04 ENCOUNTER — Telehealth: Payer: Self-pay | Admitting: Family Medicine

## 2016-11-04 DIAGNOSIS — N189 Chronic kidney disease, unspecified: Secondary | ICD-10-CM | POA: Diagnosis not present

## 2016-11-04 DIAGNOSIS — F0391 Unspecified dementia with behavioral disturbance: Secondary | ICD-10-CM | POA: Diagnosis not present

## 2016-11-04 DIAGNOSIS — Z87891 Personal history of nicotine dependence: Secondary | ICD-10-CM | POA: Diagnosis not present

## 2016-11-04 DIAGNOSIS — Z811 Family history of alcohol abuse and dependence: Secondary | ICD-10-CM | POA: Diagnosis not present

## 2016-11-04 DIAGNOSIS — R269 Unspecified abnormalities of gait and mobility: Secondary | ICD-10-CM | POA: Diagnosis not present

## 2016-11-04 DIAGNOSIS — Z8673 Personal history of transient ischemic attack (TIA), and cerebral infarction without residual deficits: Secondary | ICD-10-CM | POA: Diagnosis not present

## 2016-11-04 DIAGNOSIS — Z79899 Other long term (current) drug therapy: Secondary | ICD-10-CM | POA: Diagnosis not present

## 2016-11-04 MED ORDER — CARBAMAZEPINE ER 200 MG PO TB12: 200.0000 mg | ORAL_TABLET | Freq: Two times a day (BID) | ORAL | 0 refills | Status: AC

## 2016-11-04 MED ORDER — CITALOPRAM HYDROBROMIDE 10 MG PO TABS: 10.0000 mg | ORAL_TABLET | Freq: Every day | ORAL | 0 refills | Status: AC

## 2016-11-04 NOTE — ED Notes (Signed)
Marie Advertising account executive) called and given discharge instructions over phone since she wasn't present. Pt will be going home with her at discharge. [facility] will be transporting.

## 2016-11-04 NOTE — Telephone Encounter (Signed)
Called [name] back- they have things worked out for her dad but she will let me know if I can help in any way

## 2016-11-04 NOTE — ED Notes (Signed)
Pt being transported home by [facility]. Family notified.

## 2016-11-04 NOTE — BH Assessment (Signed)
Received a call from [facility] who states that patient is under review for placement although they are currently at capacity.   Rosalin Hawking, LCSW Therapeutic Triage Specialist [employer] [facility] 11/04/2016 4:02 AM

## 2016-11-04 NOTE — BHH Suicide Risk Assessment (Signed)
Suicide Risk Assessment  Discharge Assessment   [facility] Discharge Suicide Risk Assessment   Principal Problem: Dementia with aggressive behavior Discharge Diagnoses:  Patient Active Problem List   Diagnosis Date Noted  . Dementia with aggressive behavior [F03.91] 03/16/2016    Priority: High  . Glaucoma [H40.9] 03/16/2016  . Macular degeneration [H35.30] 03/16/2016  . Anemia in chronic kidney disease [N18.9, D63.1] 03/16/2016  . CKD (chronic kidney disease) stage 3, GFR 30-59 ml/min [N18.3] 03/16/2016  . B12 deficiency [E53.8] 03/16/2016  . History of prostate cancer [Z85.46] 03/16/2016  . History of subdural hematoma [Z86.79] 03/16/2016    Total Time spent with patient: 30 minutes   Musculoskeletal: Strength & Muscle Tone: decreased Gait & Station: unsteady Patient leans: N/A  Psychiatric Specialty Exam: Physical Exam  Constitutional: He appears well-developed.  HENT:  Head: Normocephalic.  Respiratory: Effort normal.  Musculoskeletal: Normal range of motion.  Neurological: He is alert.  Psychiatric: His speech is normal and behavior is normal. Thought content normal. His affect is labile. Cognition and memory are impaired. He expresses impulsivity.    Review of Systems  Psychiatric/Behavioral: Positive for memory loss.  All other systems reviewed and are negative.   Blood pressure 135/58, pulse 61, temperature 97.5 F (36.4 C), temperature source Oral, resp. rate 16, height 5\' 6"  (1.676 m), weight 44.9 kg (99 lb), SpO2 95 %.Body mass index is 15.98 kg/m.  General Appearance: Casual  Eye Contact:  Fair  Speech:  Normal Rate  Volume:  Decreased  Mood:  Anxious  Affect:  Blunt  Thought Process:  Irrelevant  Orientation:  Other:  alert  Thought Content:  difficult to identify, confused patietn  Suicidal Thoughts:  No  Homicidal Thoughts:  No  Memory:  Immediate;   Poor Recent;   Poor Remote;   Poor  Judgement:  Impaired  Insight:  Lacking  Psychomotor Activity:   Normal  Concentration:  Concentration: Poor and Attention Span: Poor  Recall:  Poor  Fund of Knowledge:  Fair  Language:  Fair  Akathisia:  No  Handed:  Right  AIMS (if indicated):     Assets:  Leisure Time Resilience Social Support  ADL's:  Impaired  Cognition:  Impaired,  Severe  Sleep:       Mental Status Per Nursing Assessment::   On Admission:   aggression  Demographic Factors:  Male and Age 83 or older  Loss Factors: NA  Historical Factors: NA  Risk Reduction Factors:   Sense of responsibility to family, Living with another person, especially a relative and Positive social support  Continued Clinical Symptoms:  None  Cognitive Features That Contribute To Risk:  None    Suicide Risk:  Minimal: No identifiable suicidal ideation.  Patients presenting with no risk factors but with morbid ruminations; may be classified as minimal risk based on the severity of the depressive symptoms    Plan Of Care/Follow-up recommendations:  Activity:  as tolerated Diet:  heart healthy diet  Arlyn Bumpus, NP 11/04/2016, 11:25 AM

## 2016-11-04 NOTE — ED Notes (Signed)
Pt family requesting to have MD re-eval due to patient eating very little since receiving ativan 2 days ago. MD and social worker made aware and having a family meeting.

## 2016-11-04 NOTE — Telephone Encounter (Signed)
Caller name: [name] Relationship to patient: Daughter Can be reached: [phone] Pharmacy:  Reason for call: Daughter called stating that patient was admitted to [facility] because he was fighting his caregiver. Now he is being released and family feels he should not be discharged because he is weak, can't walk and now congested. Would like advise from provider as to what they can do.

## 2016-11-04 NOTE — Progress Notes (Signed)
CSW spoke with family at length regarding discharge plans. Patient is currently medically/ psych cleared and is stable for discharge. Patients family had previously mentioned the need for SNF from home. CSW informed family that she was unable to place family from the ED but could connect family with resources at home; including home health and Education officer, museum. CSW contacted CM to establish [facility] services and any needed equipment for DC. CSW provided family with list of assisted living facilities and memory care. CSW scheduled [facility] for transportation.   Patient will be discharging to grand daughter home, [name], at [address] [place], [address].  CSW updated RN, EDP, and family.   Kingsley Spittle, LCSWA Clinical Social Worker [phone]

## 2016-11-04 NOTE — Care Management Note (Addendum)
Case Management Note  Patient Details  Name: Jonathan Lawson MRN: [id number] Date of Birth: 02/05/1922  Subjective/Objective:                  Dementia with behavioral disturbance Action/Plan: Discharge planning Expected Discharge Date:                  Expected Discharge Plan:  [place]  In-House Referral:     Discharge planning Services  CM Consult  Post Acute Care Choice:  Home Health Choice offered to:  Adult Children  DME Arranged:  3-N-1, Youth worker wheelchair with seat cushion DME Agency:  [place]:  PT, OT, RN, Nurse's Aide, Social Work [employer] Agency:  [facility] [employer]  Status of Service:  Completed, signed off  If discussed at [employer], dates discussed:    Additional Comments: CM met with pt who is unable to articulate needs and then met with family in waiting room.  [name] (daughter) [phone] states pt will be going home to stay with [name] (pt's daughter) [name] [address] [place], [place] [address] [phone] or [phone].  Family states they do not have a preference for [facility] agency and CM explained I will arrange with [facility] (as this is only agency taking pt's insurance).  Referral called to [facility] rep, Tim.  CM requested MD to please place face to face and HHPT/OT/Aide/RN/SW and to please sign the DME orders.  CM notified [facility] DME rep, Joelene Millin to please deliver the rolling walker and 3n1 to family in ED waiting room.  Tim of [facility] is waiting for face to face and [place] orders to be placed.  No other CM needs were communicated. Dellie Catholic, RN 11/04/2016, 1:19 PM

## 2016-11-04 NOTE — ED Notes (Signed)
Received pt. Asleep, no s/s of distress noted , four point soft restraint discontinued, safety and comfort measures applied, kept monitored.sitter within sight.

## 2016-11-04 NOTE — Consult Note (Signed)
[facility] Consult   Reason for Consult:  Agitation and aggression Referring Physician:  EDP Patient Identification: Jonathan Lawson MRN:  [id number] Principal Diagnosis: Dementia with aggressive behavior Diagnosis:   Patient Active Problem List   Diagnosis Date Noted  . Dementia with aggressive behavior [F03.91] 03/16/2016    Priority: High  . Glaucoma [H40.9] 03/16/2016  . Macular degeneration [H35.30] 03/16/2016  . Anemia in chronic kidney disease [N18.9, D63.1] 03/16/2016  . CKD (chronic kidney disease) stage 3, GFR 30-59 ml/min [N18.3] 03/16/2016  . B12 deficiency [E53.8] 03/16/2016  . History of prostate cancer [Z85.46] 03/16/2016  . History of subdural hematoma [Z86.79] 03/16/2016    Total Time spent with patient: 30 minutes  Subjective:   Jonathan Lawson is a 83 y.o. male patient admitted with aggression and agitation.  HPI:  83 yo male who presented to the ED with aggression.  Medications were started yesterday and he stabilized.  No suicidal/homicidal ideations or hallucinations.  Calm and cooperative, Education officer, museum will talk to the family  Past Psychiatric History: dementia  Risk to Self: Suicidal Ideation: No Suicidal Intent: No Is patient at risk for suicide?: No Suicidal Plan?: No Access to Means: No What has been your use of drugs/alcohol within the last 12 months?:  (n/a) How many times?:  (n/a) Other Self Harm Risks:  (none reported) Triggers for Past Attempts: Other (Comment) Intentional Self Injurious Behavior: None Risk to Others: Homicidal Ideation: No Thoughts of Harm to Others: No Current Homicidal Intent: No Current Homicidal Plan: No Access to Homicidal Means: No Identified Victim:  (n/a) History of harm to others?: No Assessment of Violence: None Noted Violent Behavior Description:  (patient is calm and cooperative ) Does patient have access to weapons?: No Criminal Charges Pending?: No Does patient have a court date: No Prior  Inpatient Therapy: Prior Inpatient Therapy: No Prior Therapy Dates:  (n/a) Prior Therapy Facilty/Provider(s):  (n/a) Reason for Treatment:  (n/a) Prior Outpatient Therapy: Prior Outpatient Therapy: No Prior Therapy Dates:  (n/a) Prior Therapy Facilty/Provider(s):  (n/a) Reason for Treatment:  (n/a) Does patient have an ACCT team?: No Does patient have Intensive In-House Services?  : No Does patient have [facility] services? : No Does patient have [facility] services?: No  Past Medical History:  Past Medical History:  Diagnosis Date  . B12 deficiency   . CKD (chronic kidney disease) stage 3, GFR 30-59 ml/min   . Glaucoma   . History of CVA (cerebrovascular accident)   . Macular degeneration   . Mild cognitive impairment   . Nephrolithiasis   . Prostate cancer ([place]) 1995    Past Surgical History:  Procedure Laterality Date  . PROSTATE SURGERY  1995   Family History:  Family History  Problem Relation Age of Onset  . Breast cancer Mother   . Colon cancer Maternal Grandmother   . Alcoholism Brother   . Colon cancer Son   . Prostate cancer Brother   . Prostate cancer Son   . Prostate cancer Son   . Prostate cancer Son    Family Psychiatric  History: none Social History:  History  Alcohol Use No     History  Drug Use No    Social History   Social History  . Marital status: Married    Spouse name: N/A  . Number of children: N/A  . Years of education: N/A   Social History Main Topics  . Smoking status: Former Research scientist (life sciences)  . Smokeless tobacco: Never Used  .  Alcohol use No  . Drug use: No  . Sexual activity: Not Asked   Other Topics Concern  . None   Social History Narrative  . None   Additional Social History:    Allergies:  No Known Allergies  Labs:  Results for orders placed or performed during the hospital encounter of 10/30/16 (from the past 48 hour(s))  CBC with Differential     Status: Abnormal   Collection Time: 11/02/16  1:48 PM  Result Value Ref Range    WBC 3.2 (L) 4.0 - 10.5 K/uL   RBC 4.80 4.22 - 5.81 MIL/uL   Hemoglobin 12.9 (L) 13.0 - 17.0 g/dL   HCT 41.0 39.0 - 52.0 %   MCV 85.4 78.0 - 100.0 fL   MCH 26.9 26.0 - 34.0 pg   MCHC 31.5 30.0 - 36.0 g/dL   RDW 15.2 11.5 - 15.5 %   Platelets 157 150 - 400 K/uL   Neutrophils Relative % 71 %   Neutro Abs 2.2 1.7 - 7.7 K/uL   Lymphocytes Relative 20 %   Lymphs Abs 0.6 (L) 0.7 - 4.0 K/uL   Monocytes Relative 7 %   Monocytes Absolute 0.2 0.1 - 1.0 K/uL   Eosinophils Relative 2 %   Eosinophils Absolute 0.1 0.0 - 0.7 K/uL   Basophils Relative 0 %   Basophils Absolute 0.0 0.0 - 0.1 K/uL  Ammonia     Status: None   Collection Time: 11/02/16  1:48 PM  Result Value Ref Range   Ammonia 17 9 - 35 umol/L  Comprehensive metabolic panel     Status: Abnormal   Collection Time: 11/02/16  1:48 PM  Result Value Ref Range   Sodium 142 135 - 145 mmol/L   Potassium 4.4 3.5 - 5.1 mmol/L   Chloride 114 (H) 101 - 111 mmol/L   CO2 25 22 - 32 mmol/L   Glucose, Bld 102 (H) 65 - 99 mg/dL   BUN 19 6 - 20 mg/dL   Creatinine, Ser 1.26 (H) 0.61 - 1.24 mg/dL   Calcium 8.3 (L) 8.9 - 10.3 mg/dL   Total Protein 6.5 6.5 - 8.1 g/dL   Albumin 2.8 (L) 3.5 - 5.0 g/dL   AST 29 15 - 41 U/L   ALT 12 (L) 17 - 63 U/L   Alkaline Phosphatase 77 38 - 126 U/L   Total Bilirubin 1.1 0.3 - 1.2 mg/dL   GFR calc non Af Amer 48 (L) >60 mL/min   GFR calc Af Amer 55 (L) >60 mL/min    Comment: (NOTE) The eGFR has been calculated using the CKD EPI equation. This calculation has not been validated in all clinical situations. eGFR's persistently <60 mL/min signify possible Chronic Kidney Disease.    Anion gap 3 (L) 5 - 15  Blood gas, venous     Status: None   Collection Time: 11/02/16  2:17 PM  Result Value Ref Range   pH, Ven 7.315 7.250 - 7.430   pCO2, Ven 50.8 44.0 - 60.0 mmHg   pO2, Ven BELOW REPORTABLE RANGE 32.0 - 45.0 mmHg    Comment: RBV DAN FLOYD MD BY ANGIE DUNLAP ON 11/02/16 AT 1419   Bicarbonate 25.1 20.0 -  28.0 mmol/L   Acid-base deficit 0.8 0.0 - 2.0 mmol/L   O2 Saturation 22.8 %   Patient temperature 98.6    Collection site VEIN    Drawn by COLLECTED BY LABORATORY    Sample type VENOUS     Current Facility-Administered Medications  Medication Dose Route Frequency Provider Last Rate Last Dose  . acetaminophen (TYLENOL) tablet 650 mg  650 mg Oral Q4H PRN Isla Pence, MD      . alum & mag hydroxide-simeth (MAALOX/MYLANTA) 200-200-20 MG/5ML suspension 30 mL  30 mL Oral PRN Isla Pence, MD      . carbamazepine (TEGRETOL XR) 12 hr tablet 200 mg  200 mg Oral BID Patrecia Pour, NP   200 mg at 11/04/16 5176  . citalopram (CELEXA) tablet 10 mg  10 mg Oral Daily Corena Pilgrim, MD   10 mg at 11/04/16 0828  . donepezil (ARICEPT) tablet 10 mg  10 mg Oral QHS Lurena Nida, NP   10 mg at 11/03/16 2107  . LORazepam (ATIVAN) tablet 0.5 mg  0.5 mg Oral BID PRN Patrecia Pour, NP      . ondansetron [facility]) tablet 4 mg  4 mg Oral Q8H PRN Isla Pence, MD       Current Outpatient Prescriptions  Medication Sig Dispense Refill  . cholecalciferol (VITAMIN D) 1000 units tablet Take 1,000 Units by mouth daily.    Marland Kitchen donepezil (ARICEPT) 5 MG tablet Take 1 tablet (5 mg total) by mouth at bedtime. 30 tablet 1  . ferrous sulfate 325 (65 FE) MG tablet Take 325 mg by mouth daily with breakfast.    . LORazepam (ATIVAN) 0.5 MG tablet Take 1 tablet (0.5 mg total) by mouth every 6 (six) hours as needed for anxiety. 10 tablet 0  . memantine (NAMENDA) 10 MG tablet Take 1 tablet (10 mg total) by mouth 2 (two) times daily. 60 tablet 9  . naproxen sodium (ANAPROX) 220 MG tablet Take 220 mg by mouth daily.    Marland Kitchen triamcinolone cream (KENALOG) 0.1 % Apply 1 application topically 2 (two) times daily as needed (for rash on ankles).      Musculoskeletal: Strength & Muscle Tone: decreased Gait & Station: unsteady Patient leans: N/A  Psychiatric Specialty Exam: Physical Exam  Constitutional: He appears well-developed.   HENT:  Head: Normocephalic.  Respiratory: Effort normal.  Musculoskeletal: Normal range of motion.  Neurological: He is alert.  Psychiatric: His speech is normal and behavior is normal. Thought content normal. His affect is labile. Cognition and memory are impaired. He expresses impulsivity.    Review of Systems  Psychiatric/Behavioral: Positive for memory loss.  All other systems reviewed and are negative.   Blood pressure 135/58, pulse 61, temperature 97.5 F (36.4 C), temperature source Oral, resp. rate 16, height 5' 6"  (1.676 m), weight 44.9 kg (99 lb), SpO2 95 %.Body mass index is 15.98 kg/m.  General Appearance: Casual  Eye Contact:  Fair  Speech:  Normal Rate  Volume:  Decreased  Mood:  Anxious  Affect:  Blunt  Thought Process:  Irrelevant  Orientation:  Other:  alert  Thought Content:  difficult to identify, confused patietn  Suicidal Thoughts:  No  Homicidal Thoughts:  No  Memory:  Immediate;   Poor Recent;   Poor Remote;   Poor  Judgement:  Impaired  Insight:  Lacking  Psychomotor Activity:  Normal  Concentration:  Concentration: Poor and Attention Span: Poor  Recall:  Poor  Fund of Knowledge:  Fair  Language:  Fair  Akathisia:  No  Handed:  Right  AIMS (if indicated):     Assets:  Leisure Time Resilience Social Support  ADL's:  Impaired  Cognition:  Impaired,  Severe  Sleep:        Treatment Plan Summary: Daily contact  with patient to assess and evaluate symptoms and progress in treatment, Medication management and Plan dementia with behavioral disturbances:  -Crisis stabilization -Medication management:  Continue medical medications along with Aricept 10 mg daily for dementia, Tegretol 200 mg BID for mood stabilization and Ativan 0.5 mg BID PRN anxiety -Individual counseling  Disposition: Discharge home  Waylan Boga, NP 11/04/2016 11:21 AM  Patient seen face-to-face for psychiatric evaluation, chart reviewed and case discussed with the physician  extender and developed treatment plan. Reviewed the information documented and agree with the treatment plan. Corena Pilgrim, MD

## 2016-11-06 ENCOUNTER — Telehealth: Payer: Self-pay | Admitting: *Deleted

## 2016-11-06 NOTE — Telephone Encounter (Signed)
CM inquired of [facility] rep, Tim if there were any complications with acceptance of pt into [facility]; and Tim texted back son of pt declined [place] services as it sounded like to [facility] pt was going to facility. CM asked Tim to please document in EPIC but he texted back he was no longer able to note in EPIC. This CM called [name] [phone] where pt went to recuperate and CM was told pt has passed away. I expressed my condolence and family appreciative.  NO other CM needs were communicated.

## 2016-11-12 ENCOUNTER — Telehealth: Payer: Self-pay | Admitting: Family Medicine

## 2016-11-12 NOTE — Telephone Encounter (Signed)
Called and spoke with his son Jonathan Lawson- Frd died on Nov 16, 2016 in the morning.  Presumed of natural causes- he died peacefully at a family home.  I have completed death cert and called the [employer] so they may pick up the cert

## 2016-11-13 DIAGNOSIS — 419620001 Death: Secondary | SNOMED CT | POA: Diagnosis not present

## 2016-11-13 DEATH — deceased

## 2016-12-31 ENCOUNTER — Ambulatory Visit: Payer: Medicare Other | Admitting: Neurology

## 2017-01-07 ENCOUNTER — Telehealth: Payer: Self-pay | Admitting: Family Medicine

## 2017-01-07 NOTE — Telephone Encounter (Signed)
Received a call from [employer] who is doing an investigation for this family due to potential conflict over the estate.  Answered questions to the best of my ability. He will call me back if any other concerns   Det. Wiley [phone] or after 5 call [phone]

## 2017-08-03 IMAGING — CT CT HEAD W/O CM
3 of 4 series · 14 of 47 positions shown, 16 images · non-contrast
Comparison: None.

CLINICAL DATA: Acute onset of aggressive behavior. Altered mental
status. Patient has not been eating or drinking for 3-4 days.
Initial encounter.

EXAM:
CT HEAD WITHOUT CONTRAST
TECHNIQUE: Contiguous axial images were obtained from the base of the skull
through the vertex without intravenous contrast.

[Series 2: head w/o · axial · non-contrast · 0.49mm/px · z∈[+1049,+1179]mm · 8 of 32 slices shown, 10 images]
[im 3/32  brain]
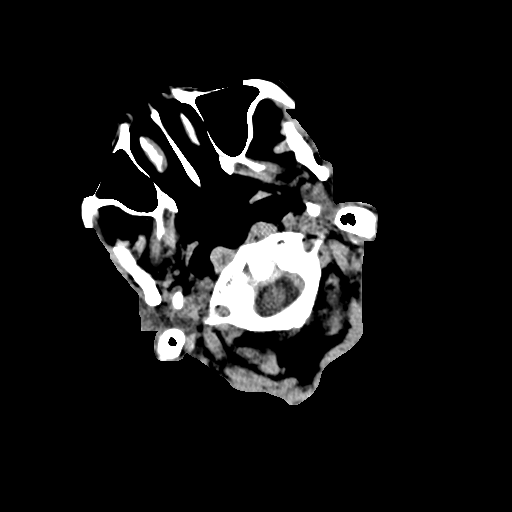
[im 3/32  bone]
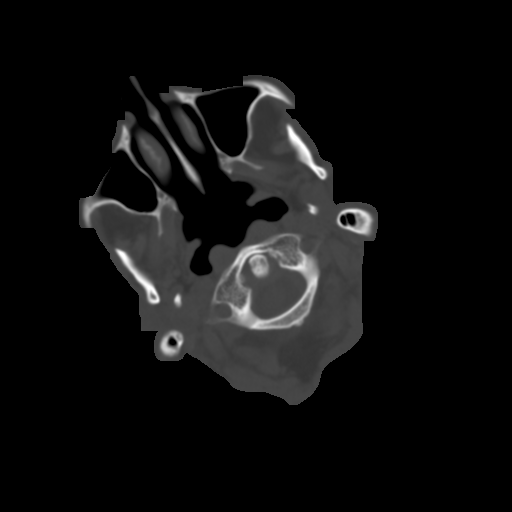
[im 7/32  brain]
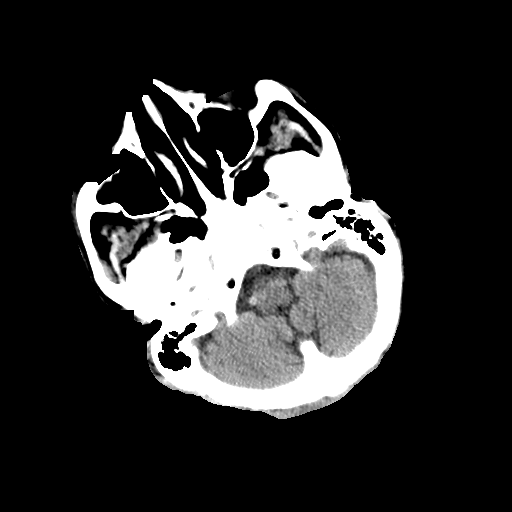
[im 12/32  brain]
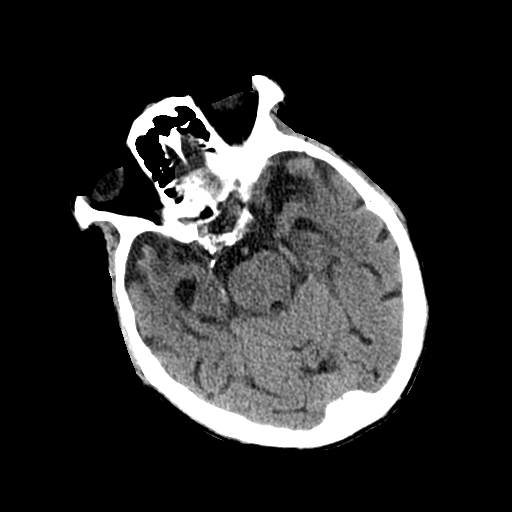
[im 14/32  brain]
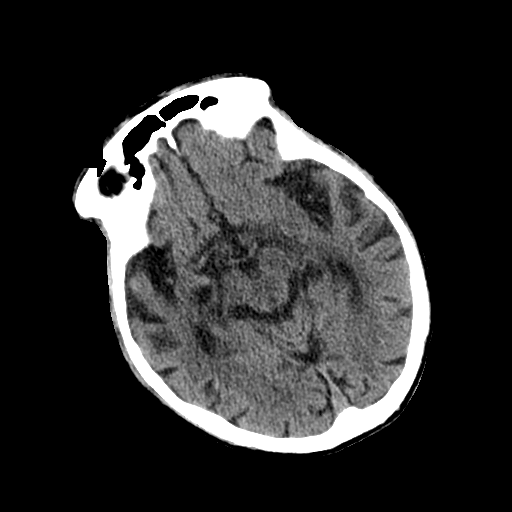
[im 18/32  brain]
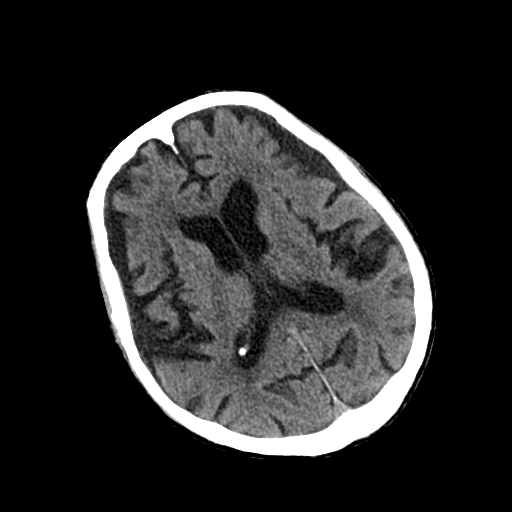
[im 18/32  bone]
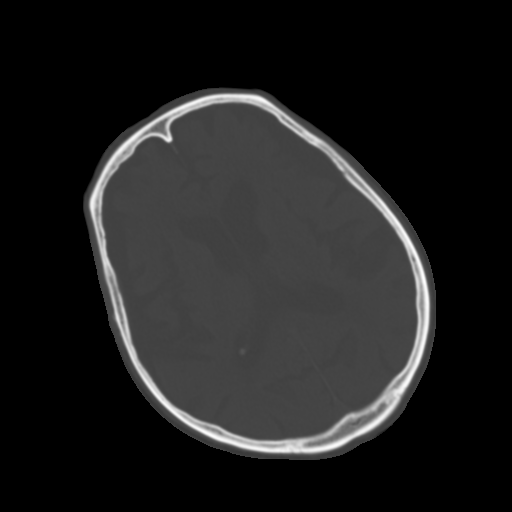
[im 20/32  brain]
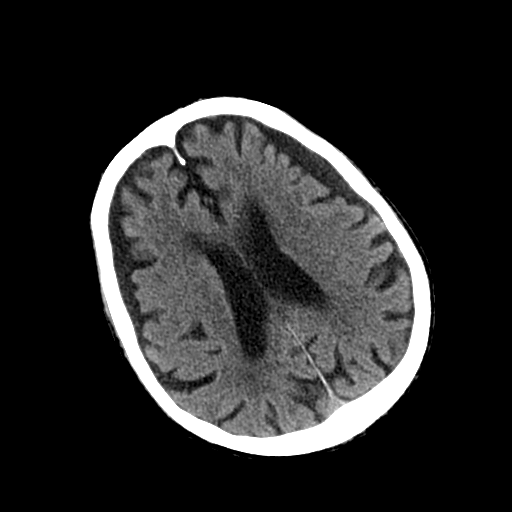
[im 25/32  brain]
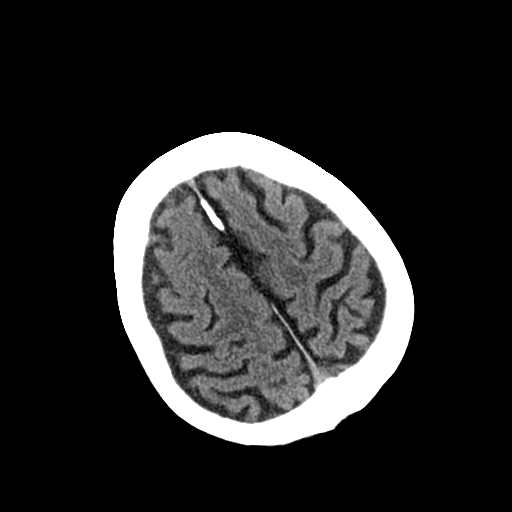
[im 29/32  brain]
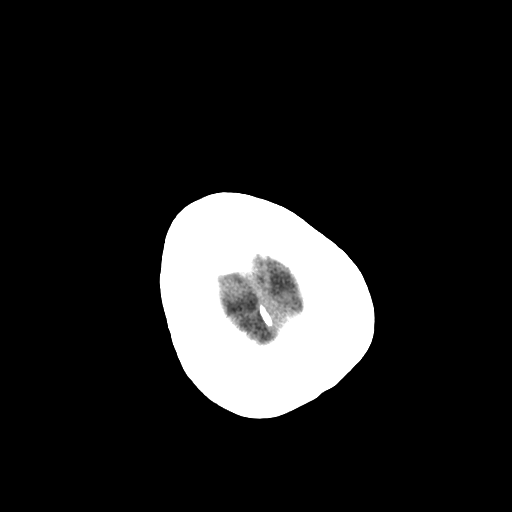

[Series 4: coronal · coronal · 0.30mm/px · 3 of 76 slices shown]
[im 26/76  brain]
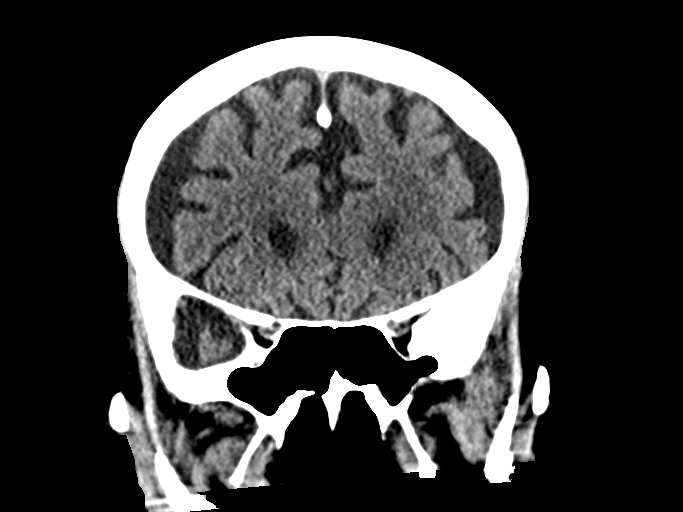
[im 34/76  brain]
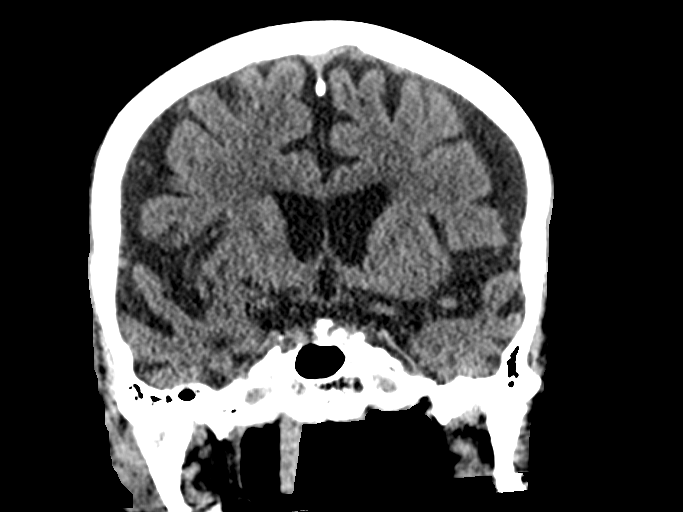
[im 42/76  brain]
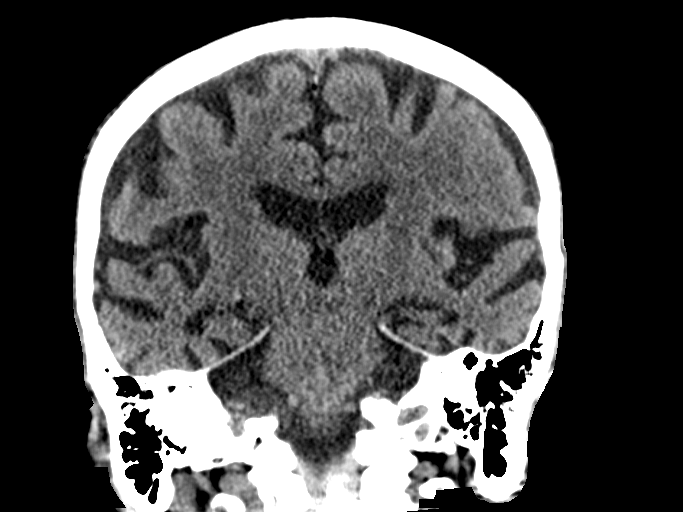

[Series 5: sagittal · sagittal · 0.30mm/px · 3 of 76 slices shown]
[im 26/76  brain]
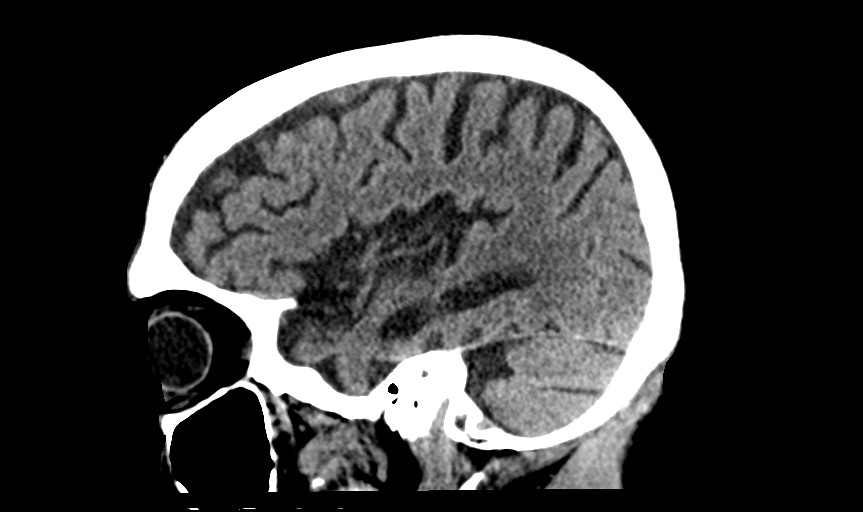
[im 38/76  brain]
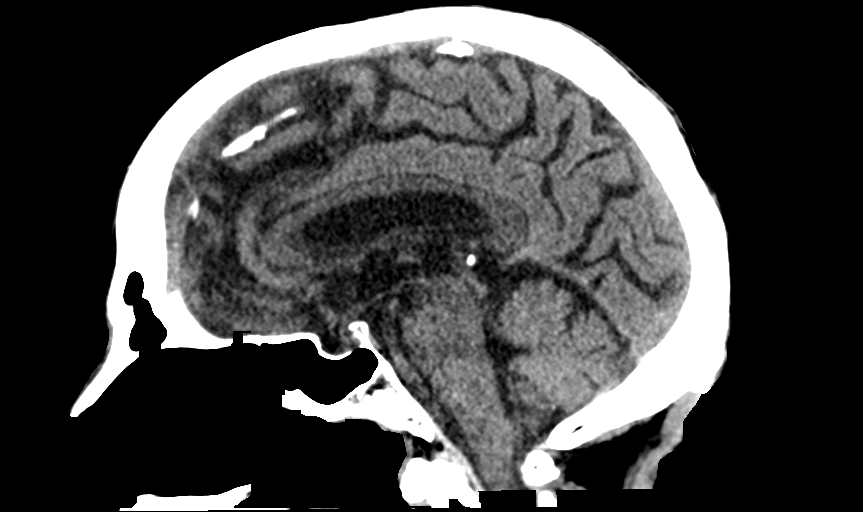
[im 51/76  brain]
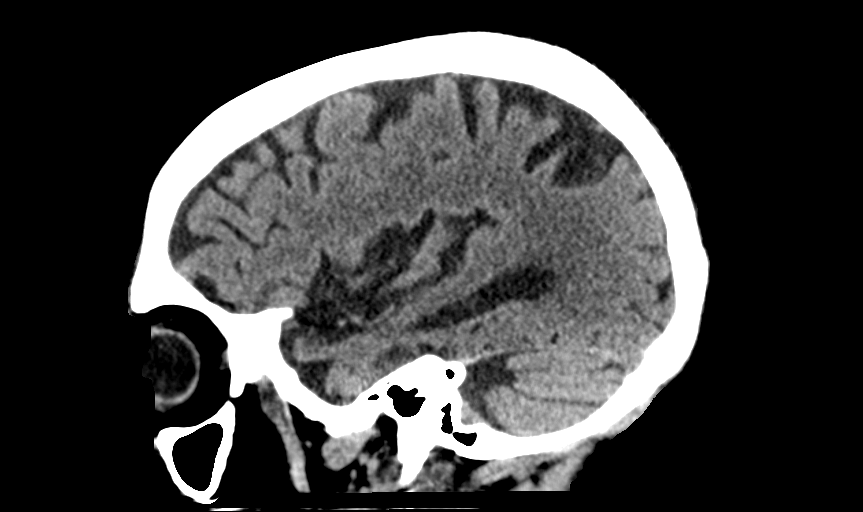

[14 of 47 positions shown; findings below may reference images not displayed]

FINDINGS: Brain: No evidence of acute infarction, hemorrhage, hydrocephalus,
extra-axial collection or mass lesion/mass effect.

Prominence of the ventricles and sulci reflects moderate cortical
volume loss. Mild cerebellar atrophy is noted. Scattered
periventricular white matter change likely reflects small vessel
ischemic microangiopathy. Chronic lacunar infarcts are seen at the
external capsule bilaterally.

The brainstem and fourth ventricle are within normal limits. The
cerebral hemispheres demonstrate grossly normal gray-white
differentiation. No mass effect or midline shift is seen.

Vascular: No hyperdense vessel or unexpected calcification.

Skull: There is no evidence of fracture; visualized osseous
structures are unremarkable in appearance.

Sinuses/Orbits: The orbits are within normal limits. The paranasal
sinuses and mastoid air cells are well-aerated.

Other: No significant soft tissue abnormalities are seen.
IMPRESSION: 1. No acute intracranial pathology seen on CT.
2. Moderate cortical volume loss and scattered small vessel ischemic
microangiopathy.
3. Chronic lacunar infarcts at the external capsule bilaterally.
